# Patient Record
Sex: Female | Born: 1990
Health system: Southern US, Community
[De-identification: ages and names within clinical notes are randomized; demographics above are authoritative.]

## PROBLEM LIST (undated history)

## (undated) DIAGNOSIS — R011 Cardiac murmur, unspecified: Secondary | ICD-10-CM

## (undated) DIAGNOSIS — I38 Endocarditis, valve unspecified: Secondary | ICD-10-CM

## (undated) DIAGNOSIS — F329 Major depressive disorder, single episode, unspecified: Secondary | ICD-10-CM

## (undated) DIAGNOSIS — F32A Depression, unspecified: Secondary | ICD-10-CM

## (undated) DIAGNOSIS — F419 Anxiety disorder, unspecified: Secondary | ICD-10-CM

## (undated) HISTORY — DX: Depression, unspecified: F32.A

## (undated) HISTORY — DX: Anxiety disorder, unspecified: F41.9

## (undated) HISTORY — PX: NO PAST SURGERIES: SHX2092

---
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
## 2011-08-30 ENCOUNTER — Emergency Department: Payer: Self-pay | Admitting: Emergency Medicine

## 2011-09-17 ENCOUNTER — Emergency Department: Payer: Self-pay | Admitting: Emergency Medicine

## 2012-04-16 ENCOUNTER — Emergency Department: Payer: Self-pay | Admitting: Emergency Medicine

## 2012-04-17 LAB — CBC
HCT: 35.5 % (ref 35.0–47.0)
HGB: 12.5 g/dL (ref 12.0–16.0)
MCV: 92 fL (ref 80–100)
Platelet: 172 10*3/uL (ref 150–440)
RDW: 12.9 % (ref 11.5–14.5)
WBC: 10.9 10*3/uL (ref 3.6–11.0)

## 2012-04-17 LAB — URINALYSIS, COMPLETE
Bilirubin,UR: NEGATIVE
Blood: NEGATIVE
Leukocyte Esterase: NEGATIVE
Ph: 6 (ref 4.5–8.0)
Protein: NEGATIVE
WBC UR: 2 /HPF (ref 0–5)

## 2012-04-17 LAB — BASIC METABOLIC PANEL
Creatinine: 0.59 mg/dL — ABNORMAL LOW (ref 0.60–1.30)
Glucose: 73 mg/dL (ref 65–99)
Osmolality: 272 (ref 275–301)
Sodium: 138 mmol/L (ref 136–145)

## 2012-04-17 LAB — HCG, QUANTITATIVE, PREGNANCY: Beta Hcg, Quant.: 34240 m[IU]/mL — ABNORMAL HIGH

## 2012-05-19 LAB — URINE CULTURE

## 2012-10-04 ENCOUNTER — Inpatient Hospital Stay: Payer: Self-pay | Admitting: Obstetrics and Gynecology

## 2012-10-04 LAB — CBC WITH DIFFERENTIAL/PLATELET
Basophil #: 0.1 10*3/uL (ref 0.0–0.1)
Basophil %: 0.3 %
Eosinophil #: 0 10*3/uL (ref 0.0–0.7)
HCT: 29.6 % — ABNORMAL LOW (ref 35.0–47.0)
Lymphocyte #: 3 10*3/uL (ref 1.0–3.6)
MCH: 27 pg (ref 26.0–34.0)
MCHC: 33 g/dL (ref 32.0–36.0)
MCV: 82 fL (ref 80–100)
Monocyte #: 1.2 x10 3/mm — ABNORMAL HIGH (ref 0.2–0.9)
Monocyte %: 5.6 %
Neutrophil #: 16.8 10*3/uL — ABNORMAL HIGH (ref 1.4–6.5)
RBC: 3.61 10*6/uL — ABNORMAL LOW (ref 3.80–5.20)
RDW: 16.2 % — ABNORMAL HIGH (ref 11.5–14.5)
WBC: 21 10*3/uL — ABNORMAL HIGH (ref 3.6–11.0)

## 2012-10-06 LAB — HEMATOCRIT: HCT: 24.8 % — ABNORMAL LOW (ref 35.0–47.0)

## 2015-03-03 NOTE — H&P (Signed)
L&D Evaluation:  History Expanded:   HPI 24 yo G1 with EDD of 10/06/12 per 1st trim US, presents at 39 5/7 weeks with c/o regular contractions. Denies LOF or VB, +FM. [facility] notable for early entry to care, low BMI with low initial weight gain (25 lb overall weight gain), anemia.    Blood Type (Maternal) A positive    Group B Strep Results Maternal (Result >5wks must be treated as unknown) positive    Maternal HIV Negative    Maternal Syphilis Ab Nonreactive    Maternal Varicella Immune    Rubella Results (Maternal) immune    Patient's Medical History Asthma  "leaky heart valve"    Patient's Surgical History none    Medications Pre Natal Vitamins    Allergies NKDA    Social History none   ROS:   ROS see HPI   Exam:   Vital Signs stable    General no apparent distress    Mental Status clear    Chest clear    Heart no murmur/gallop/rubs    Abdomen gravid, tender with contractions    Estimated Fetal Weight Average for gestational age, EFW per Leopolds6- 6.5 lbs    Fetal Position vertex    Pelvic no external lesions, from 3 cm on admission to 4 cm now per RN    Mebranes Intact    FHT normal rate with no decels, BL 135, mod variability, + accels    Ucx regular, q 2-4 min   Impression:   Impression active labor   Plan:   Plan monitor contractions and for cervical change, antibiotics for GBBS prophylaxis    Comments IV medication, epidural as desired   Electronic Signatures: Leshon Armistead, Marta Lamasamara K (CNM)  (Signed 12-Dec-13 12:36)  Authored: L&D Evaluation   Last Updated: 12-Dec-13 12:36 by Vella KohlerBrothers, Johara Lodwick K (CNM)

## 2016-01-29 ENCOUNTER — Encounter: Payer: Self-pay | Admitting: *Deleted

## 2016-01-29 ENCOUNTER — Other Ambulatory Visit: Payer: Self-pay

## 2016-01-29 MED ORDER — BUPIVACAINE HCL (PF) 0.5 % IJ SOLN
INTRAMUSCULAR | Status: AC
  Filled 2016-01-29: qty 30

## 2016-01-29 NOTE — Pre-Procedure Instructions (Signed)
CALLED DR Henrene HawkingKEPHART REGARDING PT WITH A LEAKY VALVE-DR KEPHART SAID NO EKG WAS NEEDED BUT THAT WE DID NEED TO GET THE ECHO RESULTS SO WE KNOW WHICH VALVE IT IS THAT IS LEAKING

## 2016-01-29 NOTE — Pre-Procedure Instructions (Signed)
Called [facility] where pt had her Echo done 4-5 yrs ago and they have no record of an Echo-I even looked thru our old hospital system to see if maybe there was an Echo and there was none to be found.  Pt states that Dr Glenis SmokerNeimeyer is the one that ordered the Echo and it was done at his office and they should have the results. I told the pt  i had called and they cannot locate those results. I told pt that Anesthesia wants medical clearance due to uncertainty of which valve leaks. Pt verbalized understanding. I called Har[facility] at [facility] and left her a message regarding this and that i am initiating the clearance but that her office will need to follow up with clearance. I called [facility] and informed them that pt needs to be cleared before she can have her surgery. Faxed info over to both offices.

## 2016-02-02 NOTE — Patient Instructions (Signed)
  Your procedure is scheduled on: 4/13 Report to Day Surgery. To find out your arrival time please call [phone] between 1PM - 3PM on 4/12.  Remember: Instructions that are not followed completely may result in serious medical risk, up to and including death, or upon the discretion of your surgeon and anesthesiologist your surgery may need to be rescheduled.    __x__ 1. Do not eat food or drink liquids after midnight. No gum chewing or hard candies.     __x__ 2. No Alcohol for 24 hours before or after surgery.   ____ 3. Bring all medications with you on the day of surgery if instructed.    __x__ 4. Notify your doctor if there is any change in your medical condition     (cold, fever, infections).     Do not wear jewelry, make-up, hairpins, clips or nail polish.  Do not wear lotions, powders, or perfumes. You may wear deodorant.  Do not shave 48 hours prior to surgery. Men may shave face and neck.  Do not bring valuables to the hospital.    [facility] is not responsible for any belongings or valuables.               Contacts, dentures or bridgework may not be worn into surgery.  Leave your suitcase in the car. After surgery it may be brought to your room.  For patients admitted to the hospital, discharge time is determined by your                treatment team.   Patients discharged the day of surgery will not be allowed to drive home.   Please read over the following fact sheets that you were given:      ____ Take these medicines the morning of surgery with A SIP OF WATER:    1.  2.   3.   4.  5.  6.  ____ Fleet Enema (as directed)   ____ Use CHG Soap as directed  ____ Use inhalers on the day of surgery  ____ Stop metformin 2 days prior to surgery    ____ Take 1/2 of usual insulin dose the night before surgery and none on the morning of surgery.   ____ Stop Coumadin/Plavix/aspirin on   ____ Stop Anti-inflammatories on tylenol only until surgery   ____ Stop  supplements until after surgery.    ____ Bring C-Pap to the hospital.

## 2016-02-04 ENCOUNTER — Ambulatory Visit
Admission: RE | Admit: 2016-02-04 | Discharge: 2016-02-04 | Disposition: A | Discharge status: Home / Self Care | Payer: Medicaid Other | Source: Ambulatory Visit | Attending: Obstetrics and Gynecology | Admitting: Obstetrics and Gynecology

## 2016-02-04 ENCOUNTER — Ambulatory Visit: Payer: Medicaid Other | Admitting: Anesthesiology

## 2016-02-04 ENCOUNTER — Encounter: Payer: Self-pay | Admitting: *Deleted

## 2016-02-04 ENCOUNTER — Encounter
Admission: RE | Disposition: A | Discharge status: Home / Self Care | Payer: Self-pay | Source: Ambulatory Visit | Attending: Obstetrics and Gynecology

## 2016-02-04 ENCOUNTER — Ambulatory Visit: Payer: Medicaid Other

## 2016-02-04 DIAGNOSIS — Z808 Family history of malignant neoplasm of other organs or systems: Secondary | ICD-10-CM | POA: Insufficient documentation

## 2016-02-04 DIAGNOSIS — Z8249 Family history of ischemic heart disease and other diseases of the circulatory system: Secondary | ICD-10-CM | POA: Diagnosis not present

## 2016-02-04 DIAGNOSIS — Z841 Family history of disorders of kidney and ureter: Secondary | ICD-10-CM | POA: Diagnosis not present

## 2016-02-04 DIAGNOSIS — J45909 Unspecified asthma, uncomplicated: Secondary | ICD-10-CM | POA: Insufficient documentation

## 2016-02-04 DIAGNOSIS — Z975 Presence of (intrauterine) contraceptive device: Secondary | ICD-10-CM | POA: Diagnosis present

## 2016-02-04 DIAGNOSIS — T859XXA Unspecified complication of internal prosthetic device, implant and graft, initial encounter: Secondary | ICD-10-CM | POA: Insufficient documentation

## 2016-02-04 DIAGNOSIS — Z801 Family history of malignant neoplasm of trachea, bronchus and lung: Secondary | ICD-10-CM | POA: Insufficient documentation

## 2016-02-04 DIAGNOSIS — Z189 Retained foreign body fragments, unspecified material: Secondary | ICD-10-CM

## 2016-02-04 HISTORY — DX: Endocarditis, valve unspecified: I38

## 2016-02-04 HISTORY — DX: Cardiac murmur, unspecified: R01.1

## 2016-02-04 HISTORY — PX: HYSTEROSCOPY: SHX211

## 2016-02-04 HISTORY — PX: LAPAROSCOPY: SHX197

## 2016-02-04 LAB — POCT PREGNANCY, URINE: Preg Test, Ur: NEGATIVE

## 2016-02-04 SURGERY — HYSTEROSCOPY
Anesthesia: General | Wound class: Clean Contaminated

## 2016-02-04 MED ORDER — ONDANSETRON HCL 4 MG/2ML IJ SOLN
INTRAMUSCULAR | Status: AC
  Filled 2016-02-04: qty 2

## 2016-02-04 MED ORDER — FAMOTIDINE 20 MG PO TABS
ORAL_TABLET | ORAL | Status: AC
  Administered 2016-02-04: 20 mg via ORAL
  Filled 2016-02-04: qty 1

## 2016-02-04 MED ORDER — FENTANYL CITRATE (PF) 100 MCG/2ML IJ SOLN
INTRAMUSCULAR | Status: DC | PRN
  Administered 2016-02-04: 25 ug via INTRAVENOUS
  Administered 2016-02-04: 50 ug via INTRAVENOUS

## 2016-02-04 MED ORDER — KETOROLAC TROMETHAMINE 15 MG/ML IJ SOLN
INTRAMUSCULAR | Status: DC | PRN
  Administered 2016-02-04: 30 mg via INTRAVENOUS

## 2016-02-04 MED ORDER — IBUPROFEN 600 MG PO TABS: 600.0000 mg | ORAL_TABLET | Freq: Four times a day (QID) | ORAL | Status: DC | PRN

## 2016-02-04 MED ORDER — HYDROCODONE-ACETAMINOPHEN 5-325 MG PO TABS: 1.0000 | ORAL_TABLET | Freq: Four times a day (QID) | ORAL | Status: DC | PRN

## 2016-02-04 MED ORDER — NEOSTIGMINE METHYLSULFATE 10 MG/10ML IV SOLN
INTRAVENOUS | Status: DC | PRN
  Administered 2016-02-04: 4 mg via INTRAVENOUS

## 2016-02-04 MED ORDER — ONDANSETRON HCL 4 MG/2ML IJ SOLN
4.0000 mg | Freq: Once | INTRAMUSCULAR | Status: AC | PRN
  Administered 2016-02-04: 4 mg via INTRAVENOUS

## 2016-02-04 MED ORDER — VASOPRESSIN 20 UNIT/ML IJ SOLN
INTRAMUSCULAR | Status: DC | PRN
  Administered 2016-02-04: 2.5 [IU]

## 2016-02-04 MED ORDER — EPHEDRINE SULFATE 50 MG/ML IJ SOLN
INTRAMUSCULAR | Status: DC | PRN
  Administered 2016-02-04 (×2): 10 mg via INTRAVENOUS

## 2016-02-04 MED ORDER — FENTANYL CITRATE (PF) 100 MCG/2ML IJ SOLN
INTRAMUSCULAR | Status: AC
  Administered 2016-02-04: 25 ug via INTRAVENOUS
  Filled 2016-02-04: qty 2

## 2016-02-04 MED ORDER — ROCURONIUM BROMIDE 100 MG/10ML IV SOLN
INTRAVENOUS | Status: DC | PRN
  Administered 2016-02-04: 35 mg via INTRAVENOUS

## 2016-02-04 MED ORDER — MIDAZOLAM HCL 5 MG/5ML IJ SOLN
INTRAMUSCULAR | Status: DC | PRN
  Administered 2016-02-04: 2 mg via INTRAVENOUS

## 2016-02-04 MED ORDER — ACETAMINOPHEN 160 MG/5ML PO SOLN
10.0000 mg/kg | Freq: Once | ORAL | Status: DC
  Filled 2016-02-04: qty 20

## 2016-02-04 MED ORDER — PROMETHAZINE HCL 25 MG RE SUPP
RECTAL | Status: AC
  Filled 2016-02-04: qty 1

## 2016-02-04 MED ORDER — FENTANYL CITRATE (PF) 100 MCG/2ML IJ SOLN
25.0000 ug | INTRAMUSCULAR | Status: DC | PRN
  Administered 2016-02-04: 25 ug via INTRAVENOUS

## 2016-02-04 MED ORDER — PROPOFOL 10 MG/ML IV BOLUS
INTRAVENOUS | Status: DC | PRN
  Administered 2016-02-04: 150 mg via INTRAVENOUS

## 2016-02-04 MED ORDER — BUPIVACAINE HCL (PF) 0.5 % IJ SOLN
INTRAMUSCULAR | Status: AC
Start: 2016-02-04 — End: 2016-02-04
  Filled 2016-02-04: qty 30

## 2016-02-04 MED ORDER — GLYCOPYRROLATE 0.2 MG/ML IJ SOLN
INTRAMUSCULAR | Status: DC | PRN
  Administered 2016-02-04: 0.6 mg via INTRAVENOUS

## 2016-02-04 MED ORDER — PROMETHAZINE HCL 25 MG RE SUPP
25.0000 mg | Freq: Once | RECTAL | Status: AC
  Administered 2016-02-04: 25 mg via RECTAL

## 2016-02-04 MED ORDER — ACETAMINOPHEN 80 MG RE SUPP
10.0000 mg/kg | Freq: Once | RECTAL | Status: DC
  Filled 2016-02-04: qty 1

## 2016-02-04 MED ORDER — LIDOCAINE HCL (CARDIAC) 20 MG/ML IV SOLN
INTRAVENOUS | Status: DC | PRN
  Administered 2016-02-04: 80 mg via INTRAVENOUS

## 2016-02-04 MED ORDER — LACTATED RINGERS IV SOLN
INTRAVENOUS | Status: DC
  Administered 2016-02-04 (×2): via INTRAVENOUS

## 2016-02-04 MED ORDER — ONDANSETRON HCL 4 MG/2ML IJ SOLN
INTRAMUSCULAR | Status: DC | PRN
  Administered 2016-02-04: 4 mg via INTRAVENOUS

## 2016-02-04 MED ORDER — FAMOTIDINE 20 MG PO TABS
20.0000 mg | ORAL_TABLET | Freq: Once | ORAL | Status: AC
  Administered 2016-02-04: 20 mg via ORAL

## 2016-02-04 SURGICAL SUPPLY — 38 items
BLADE SURG SZ11 CARB STEEL (BLADE) ×3 IMPLANT
CANISTER SUCT 1200ML W/VALVE (MISCELLANEOUS) ×3 IMPLANT
CANISTER SUCT 3000ML (MISCELLANEOUS) ×3 IMPLANT
CATH ROBINSON RED A/P 16FR (CATHETERS) ×3 IMPLANT
CHLORAPREP W/TINT 26ML (MISCELLANEOUS) ×3 IMPLANT
DRSG TEGADERM 2-3/8X2-3/4 SM (GAUZE/BANDAGES/DRESSINGS) ×3 IMPLANT
GAUZE SPONGE NON-WVN 2X2 STRL (MISCELLANEOUS) ×1 IMPLANT
GLOVE BIO SURGEON STRL SZ7 (GLOVE) ×12 IMPLANT
GLOVE INDICATOR 7.5 STRL GRN (GLOVE) ×3 IMPLANT
GOWN STRL REUS W/ TWL LRG LVL3 (GOWN DISPOSABLE) ×2 IMPLANT
GOWN STRL REUS W/TWL LRG LVL3 (GOWN DISPOSABLE) ×4
IRRIGATION STRYKERFLOW (MISCELLANEOUS) ×1 IMPLANT
IRRIGATOR STRYKERFLOW (MISCELLANEOUS) ×3
IV LACTATED RINGERS 1000ML (IV SOLUTION) ×3 IMPLANT
KIT RM TURNOVER CYSTO AR (KITS) ×3 IMPLANT
LABEL OR SOLS (LABEL) ×3 IMPLANT
LIQUID BAND (GAUZE/BANDAGES/DRESSINGS) ×6 IMPLANT
MYOSURE LITE POLYP REMOVAL (MISCELLANEOUS) ×3 IMPLANT
NS IRRIG 500ML POUR BTL (IV SOLUTION) ×3 IMPLANT
PACK DNC HYST (MISCELLANEOUS) ×3 IMPLANT
PACK GYN LAPAROSCOPIC (MISCELLANEOUS) ×3 IMPLANT
PAD OB MATERNITY 4.3X12.25 (PERSONAL CARE ITEMS) ×3 IMPLANT
PAD PREP 24X41 OB/GYN DISP (PERSONAL CARE ITEMS) ×3 IMPLANT
SCISSORS METZENBAUM CVD 33 (INSTRUMENTS) ×3 IMPLANT
SEAL ROD LENS SCOPE MYOSURE (ABLATOR) ×3 IMPLANT
SET CYSTO W/LG BORE CLAMP LF (SET/KITS/TRAYS/PACK) ×3 IMPLANT
SHEARS HARMONIC ACE PLUS 36CM (ENDOMECHANICALS) IMPLANT
SLEEVE ENDOPATH XCEL 5M (ENDOMECHANICALS) ×3 IMPLANT
SPONGE VERSALON 2X2 STRL (MISCELLANEOUS) ×2
SUT MNCRL AB 4-0 PS2 18 (SUTURE) ×3 IMPLANT
SUT VIC AB 0 CT2 27 (SUTURE) ×3 IMPLANT
SYRINGE 10CC LL (SYRINGE) ×3 IMPLANT
TROCAR ENDO BLADELESS 11MM (ENDOMECHANICALS) IMPLANT
TROCAR XCEL NON-BLD 5MMX100MML (ENDOMECHANICALS) ×3 IMPLANT
TUBING CONNECTING 10 (TUBING) ×2 IMPLANT
TUBING CONNECTING 10' (TUBING) ×1
TUBING HYSTEROSCOPY DOLPHIN (MISCELLANEOUS) IMPLANT
TUBING INSUFFLATOR HI FLOW (MISCELLANEOUS) ×3 IMPLANT

## 2016-02-04 NOTE — Progress Notes (Signed)
Phenergan 25mg  supp given for contain nausea   Not having a lot of pain but just soreness

## 2016-02-04 NOTE — Transfer of Care (Signed)
Immediate Anesthesia Transfer of Care Note  Patient: Lori Dixon  Procedure(s) Performed: Procedure(s): HYSTEROSCOPY WITH MYOSURE (N/A) LAPAROSCOPY DIAGNOSTIC (N/A)  Patient Location: PACU  Anesthesia Type:General  Level of Consciousness: responds to stimulation, sleeping  Airway & Oxygen Therapy: Patient Spontanous Breathing and Patient connected to face mask oxygen  Post-op Assessment: Report given to RN and Post -op Vital signs reviewed and stable  Post vital signs: Reviewed and stable  Last Vitals:  Filed Vitals:   02/04/16 1217 02/04/16 1552  BP: 112/71 125/79  Pulse: 65 61  Temp: 37.1 C   Resp: 18 23    Complications: No apparent anesthesia complications

## 2016-02-04 NOTE — Anesthesia Procedure Notes (Addendum)
Procedure Name: Intubation Date/Time: 02/04/2016 1:49 PM Performed by: Chong SicilianLOPEZ, Jatasia Gundrum Pre-anesthesia Checklist: Patient identified, Emergency Drugs available, Suction available, Patient being monitored and Timeout performed Patient Re-evaluated:Patient Re-evaluated prior to inductionOxygen Delivery Method: Circle system utilized Preoxygenation: Pre-oxygenation with 100% oxygen Intubation Type: IV induction Ventilation: Mask ventilation without difficulty LMA Size: 3.5 Tube type: Oral Number of attempts: 1 Airway Equipment and Method: Stylet Placement Confirmation: ETT inserted through vocal cords under direct vision,  positive ETCO2 and breath sounds checked- equal and bilateral Tube secured with: Tape Dental Injury: Teeth and Oropharynx as per pre-operative assessment    Procedure Name: Intubation Date/Time: 02/04/2016 2:11 PM Performed by: Chong SicilianLOPEZ, Avrian Delfavero Pre-anesthesia Checklist: Patient identified, Emergency Drugs available, Suction available, Patient being monitored and Timeout performed Patient Re-evaluated:Patient Re-evaluated prior to inductionOxygen Delivery Method: Circle system utilized Preoxygenation: Pre-oxygenation with 100% oxygen Intubation Type: IV induction Ventilation: Mask ventilation without difficulty Laryngoscope Size: Mac and 3 Grade View: Grade I Tube type: Oral Tube size: 7.0 mm Number of attempts: 1 Airway Equipment and Method: Stylet Placement Confirmation: ETT inserted through vocal cords under direct vision,  positive ETCO2 and breath sounds checked- equal and bilateral Secured at: 21 cm Tube secured with: Tape Dental Injury: Teeth and Oropharynx as per pre-operative assessment  Comments: Intubated because case was converted to laparoscopic

## 2016-02-04 NOTE — H&P (Signed)
Initial clinic H&P on chart and reviewed  History reviewed, patient examined, no change in status, stable for surgery.

## 2016-02-04 NOTE — Anesthesia Preprocedure Evaluation (Signed)
Anesthesia Evaluation  Patient identified by MRN, date of birth, ID band Patient awake    Reviewed: Allergy & Precautions, NPO status , Patient's Chart, lab work & pertinent test results  History of Anesthesia Complications Negative for: history of anesthetic complications  Airway Mallampati: II       Dental   Pulmonary neg pulmonary ROS,           Cardiovascular negative cardio ROS  + Valvular Problems/Murmurs (as a child)      Neuro/Psych negative neurological ROS     GI/Hepatic negative GI ROS, Neg liver ROS,   Endo/Other  negative endocrine ROS  Renal/GU negative Renal ROS     Musculoskeletal   Abdominal   Peds  Hematology negative hematology ROS (+)   Anesthesia Other Findings   Reproductive/Obstetrics                             Anesthesia Physical Anesthesia Plan  ASA: II  Anesthesia Plan: General   Post-op Pain Management:    Induction: Intravenous  Airway Management Planned: LMA  Additional Equipment:   Intra-op Plan:   Post-operative Plan:   Informed Consent: I have reviewed the patients History and Physical, chart, labs and discussed the procedure including the risks, benefits and alternatives for the proposed anesthesia with the patient or authorized representative who has indicated his/her understanding and acceptance.     Plan Discussed with:   Anesthesia Plan Comments:         Anesthesia Quick Evaluation

## 2016-02-04 NOTE — Discharge Instructions (Signed)

## 2016-02-04 NOTE — Progress Notes (Signed)
zofran given for nausea   Vomited up scant amt of bile

## 2016-02-04 NOTE — Op Note (Signed)
Preoperative Diagnosis: 1) 25 y.o.  With concern for retained drug eluding sleeve of Mirena IUD  Postoperative Diagnosis: 1) 25 y.o. no evidence of drug eluding sleeve of Mirena IUD  Operation Performed: Hysteroscopy, diagnostic laparoscopy  Indication: 25 y.o. who had a [facility]rena [facility], had attempted removal late last year and a portion of the IUD broke during removal.  On removal in the office after being seen by myself for consultation the IUD was removed but no drug eluding sleeve was visualized.  Patient lost to follow up because of lapse in insurance but subsequent transvaginal ultrasound showed a linear area in the posterior endometrium that was concerning for retained IUD sleeve.    Anesthesia: General  Preoperative Antibiotics: none  Estimated Blood Loss: minimal  IV Fluids: 1L  Drains or Tubes: none  Implants: none  Specimens Removed: endometrial curettings  Complications: none  Intraoperative Findings: Hysteroscopy revealed normal uterine cavity no evidence of defects, there was a small dimpling noted in mid fundus just left of midline.  Sample of endometrium were taken to examine for progestin effect.  The lining was thicker than what would be expected with Mirena IUD in situ.  Decision was made to examine uterus abdominal via laparoscopy.  This revealed normal tubes, ovaries, and uterus. The cul de sac had minimal filmy adhesion, liver and gallbladder appeared normal, to omentum was free of evidence of foreign body.  There was a small area in the left posterior fundus which was raised.  This was injected with 14mL of 20U/19100mL NS vasopressin solution, then incised with cold scissors with no evidence of IUD sleeve.  An intraoperative X-ray did not reveal evidence of linear structure consistent with sleeve of IUD.  The uterus was visualized secondary to some retained fluid from the hysteroscopy.  Patient Condition: stable  Procedure in Detail:  Patient  was taken to the operating room where she was administered general anesthesia.  She was positioned in the dorsal lithotomy position utilizing Allen stirups, prepped and draped in the usual sterile fashion.  Prior to proceeding with procedure a time out was performed.  Attention was turned to the patient's pelvis.  A red rubber catheter was used to empty the patient's bladder.  An operative speculum was placed to allow visualization of the cervix.  The anterior lip of the cervix was grasped with a single tooth tenaculum, and the cervix was sequentially dilated using Pratt dilators.  A Myosure hysteroscope was advanced into the uterine cavity noting the above findings.  The mysoure was used to collect a small sample of the endometrial lining for pathology.  The myometrium was not explored in an attempt to localize the IUD sleeve.   Hulka tenaculum was placed to allow manipulation of the uterus.  The operative speculum and single tooth tenaculum were then removed.  Attention was turned to the patient's abdomen.  The umbilicus was incised using an 11 blade scalpel.  A 5mm Excel trocar was then used to gain direct entry into the peritoneal cavity utilizing the camera to visualize progress of the trocar during placement.  Once peritoneal entry had been achieved, insufflation was started and pneumoperitoneum established at a pressure of 15mmHg.   General inspection of the abdomen revealed the above noted findings.  A second left lower quadrant port was also incised using an 11 blade scalpel.  A second 5mm excel trocar was place through this incision under direct visualization. Inspection noted the above findings.  The area in  question in the posterior left fundus was injected with 14 mL of 20U/140mL vasopressin with good blanching noted.  The uterine serosa overlying the area was incised using cold scissors with the incision being approxiamtely 5mm.  The area was probed but no IUD sleeve was visualized or palpated.  This  was very superficial and did not extend greater than 3mm into the myometrium. The serosal edges were cauterized to ensure no bleeding postoperatively. At this point and X-ray of the abdomen and pelvis were obtained with no evidence of linear structure to suggest retained foreign body/  Pneumoperitoneum was evacuated.  The trocars were removed.  All trocar sites were then dressed with surgical skin glue.  The Hulka tenaculum was removed.  Sponge needle and instrument counts were correct time two.  The patient tolerated the procedure well and was taken to the recovery room in stable condition.

## 2016-02-04 NOTE — Anesthesia Postprocedure Evaluation (Signed)
Anesthesia Post Note  Patient: Lori Dixon  Procedure(s) Performed: Procedure(s) (LRB): HYSTEROSCOPY WITH MYOSURE (N/A) LAPAROSCOPY DIAGNOSTIC (N/A)  Patient location during evaluation: PACU Anesthesia Type: General Level of consciousness: awake and alert and oriented Pain management: pain level controlled Vital Signs Assessment: post-procedure vital signs reviewed and stable Respiratory status: spontaneous breathing Cardiovascular status: blood pressure returned to baseline Anesthetic complications: no    Last Vitals:  Filed Vitals:   02/04/16 1716 02/04/16 1744  BP:  108/61  Pulse: 66 79  Temp:    Resp: 18 16    Last Pain:  Filed Vitals:   02/04/16 1745  PainSc: 3                  Henretta Quist

## 2016-02-05 ENCOUNTER — Encounter: Payer: Self-pay | Admitting: Obstetrics and Gynecology

## 2016-02-08 LAB — SURGICAL PATHOLOGY

## 2016-05-06 ENCOUNTER — Encounter: Payer: Self-pay | Admitting: Obstetrics and Gynecology

## 2016-10-24 DIAGNOSIS — Z113 Encounter for screening for infections with a predominantly sexual mode of transmission: Secondary | ICD-10-CM | POA: Insufficient documentation

## 2018-10-01 DIAGNOSIS — N911 Secondary amenorrhea: Secondary | ICD-10-CM | POA: Diagnosis not present

## 2018-10-01 DIAGNOSIS — Z3201 Encounter for pregnancy test, result positive: Secondary | ICD-10-CM | POA: Diagnosis not present

## 2018-10-08 DIAGNOSIS — O3680X0 Pregnancy with inconclusive fetal viability, not applicable or unspecified: Secondary | ICD-10-CM | POA: Diagnosis not present

## 2018-10-08 DIAGNOSIS — Z3A01 Less than 8 weeks gestation of pregnancy: Secondary | ICD-10-CM | POA: Diagnosis not present

## 2018-11-05 DIAGNOSIS — Z23 Encounter for immunization: Secondary | ICD-10-CM | POA: Diagnosis not present

## 2018-11-05 DIAGNOSIS — Z113 Encounter for screening for infections with a predominantly sexual mode of transmission: Secondary | ICD-10-CM | POA: Diagnosis not present

## 2018-11-05 DIAGNOSIS — F330 Major depressive disorder, recurrent, mild: Secondary | ICD-10-CM | POA: Diagnosis not present

## 2018-11-05 DIAGNOSIS — Z36 Encounter for antenatal screening for chromosomal anomalies: Secondary | ICD-10-CM | POA: Diagnosis not present

## 2018-11-05 DIAGNOSIS — Z118 Encounter for screening for other infectious and parasitic diseases: Secondary | ICD-10-CM | POA: Diagnosis not present

## 2018-11-05 DIAGNOSIS — Z3481 Encounter for supervision of other normal pregnancy, first trimester: Secondary | ICD-10-CM | POA: Diagnosis not present

## 2018-12-31 DIAGNOSIS — O09899 Supervision of other high risk pregnancies, unspecified trimester: Secondary | ICD-10-CM | POA: Insufficient documentation

## 2018-12-31 DIAGNOSIS — Z3A19 19 weeks gestation of pregnancy: Secondary | ICD-10-CM | POA: Diagnosis not present

## 2018-12-31 DIAGNOSIS — Z363 Encounter for antenatal screening for malformations: Secondary | ICD-10-CM | POA: Diagnosis not present

## 2018-12-31 DIAGNOSIS — O358XX0 Maternal care for other (suspected) fetal abnormality and damage, not applicable or unspecified: Secondary | ICD-10-CM | POA: Diagnosis not present

## 2019-01-29 DIAGNOSIS — Z3A23 23 weeks gestation of pregnancy: Secondary | ICD-10-CM | POA: Diagnosis not present

## 2019-03-04 DIAGNOSIS — Z131 Encounter for screening for diabetes mellitus: Secondary | ICD-10-CM | POA: Diagnosis not present

## 2019-03-04 DIAGNOSIS — Z3483 Encounter for supervision of other normal pregnancy, third trimester: Secondary | ICD-10-CM | POA: Diagnosis not present

## 2019-04-01 DIAGNOSIS — Z3A32 32 weeks gestation of pregnancy: Secondary | ICD-10-CM | POA: Diagnosis not present

## 2019-04-01 DIAGNOSIS — O09893 Supervision of other high risk pregnancies, third trimester: Secondary | ICD-10-CM | POA: Diagnosis not present

## 2019-06-13 DIAGNOSIS — Z1389 Encounter for screening for other disorder: Secondary | ICD-10-CM | POA: Diagnosis not present

## 2019-07-11 DIAGNOSIS — Z118 Encounter for screening for other infectious and parasitic diseases: Secondary | ICD-10-CM | POA: Diagnosis not present

## 2019-07-11 DIAGNOSIS — Z01419 Encounter for gynecological examination (general) (routine) without abnormal findings: Secondary | ICD-10-CM | POA: Diagnosis not present

## 2019-07-31 DIAGNOSIS — Z111 Encounter for screening for respiratory tuberculosis: Secondary | ICD-10-CM | POA: Diagnosis not present

## 2019-07-31 DIAGNOSIS — Z113 Encounter for screening for infections with a predominantly sexual mode of transmission: Secondary | ICD-10-CM | POA: Diagnosis not present

## 2019-08-12 ENCOUNTER — Other Ambulatory Visit: Payer: Self-pay

## 2019-08-12 ENCOUNTER — Ambulatory Visit (LOCAL_COMMUNITY_HEALTH_CENTER): Payer: Medicaid Other

## 2019-08-12 DIAGNOSIS — Z23 Encounter for immunization: Secondary | ICD-10-CM

## 2019-08-12 NOTE — Progress Notes (Signed)
Client presents for Hepatitis B vaccine as recent titer at [facility] in [place] indicated [place]. Client does not have titer results with her, but thinks they may be in her car. Returned to car to ascertain if results available. Previously received 3 hepatitis B vaccines per [facility]. Client returned to clinic with copy of titer results which are non-reactive. Copy labeled and sent for scanning. HBV and Flu vaccines administered and client aware titer needs to be repeated in 4 weeks. Rich Number, RN

## 2019-09-05 ENCOUNTER — Ambulatory Visit: Payer: Medicaid Other

## 2019-09-10 ENCOUNTER — Other Ambulatory Visit: Payer: Self-pay

## 2019-09-10 ENCOUNTER — Ambulatory Visit (LOCAL_COMMUNITY_HEALTH_CENTER): Payer: Medicaid Other

## 2019-09-10 ENCOUNTER — Ambulatory Visit: Payer: Medicaid Other

## 2019-09-10 DIAGNOSIS — Z0184 Encounter for antibody response examination: Secondary | ICD-10-CM | POA: Diagnosis not present

## 2019-09-10 NOTE — Progress Notes (Signed)
Pt here for Hep B Titer only. Pt counseled that she should get a call when test results are in. Pt sent to finance to pay and sent to lab.Ronny Bacon, RN

## 2019-09-11 ENCOUNTER — Telehealth: Payer: Self-pay

## 2019-09-11 LAB — HEPATITIS B SURFACE ANTIBODY, QUANTITATIVE: Hepatitis B Surf Ab Quant: >1000 m[IU]/mL (ref >9.9)

## 2019-09-11 NOTE — Telephone Encounter (Signed)
Notified Hepatitis B titer result indicates immunity and no additional vaccine needed at this time. Copy of results placed in sealed envelope for client to retrieve at information booth. Envelope noted to send client to [facility] to complete ROI. Rich Number, RN

## 2020-02-05 NOTE — Progress Notes (Signed)
New Patient Office Visit  Leo Rod Wolford,acting as a Neurosurgeon for [employer], FNP.,have documented all relevant documentation on the behalf of Jairo Ben, FNP,as directed by  Jairo Ben, FNP while in the presence of Jairo Ben, FNP. Subjective:  Patient ID: Lori Dixon, female    DOB: 08/23/1991  Age: 29 y.o. MRN: [id number]  CC:  Chief Complaint  Patient presents with  . New Patient (Initial Visit)    HPI Lori Dixon presents for new patient appointment. Patient states that she has a PMH of anxiety and depression and would like to address those problems today. Patient has previously been on Sertraline in the past but patient reports poor symptom control on medication. Patient reports that she has had two sucide attempts previously in past with overuse of pills, patient states today that she has sucidal thoughts at times but no plans, she also denies homicidal thoughts.  Patient states that she would like to discuss contraception management today, patient is in a monogamous relationship and reports that she has 3 children. Patient had concerns of absence of menses she reports LMP was 11/25/19. Patient states at home pregnancy test were negative, she denies any past history of irregular menses.  Patient reports that she has had difficulty sleeping at night and reports waking up often, on average patient reports that she sleeps 6hrs a night. Patient reports that she is actively exercising and going to gym,  -Pt Due for PAP, she last reports 2018   Results were normal according to patient  - appears she sees gynecology at [facility] in care everywhere .  Constipation bowel movement 1 every 3 days. Denies any blood, tarry stools or pain with passing bowel movements.  Denies any dizziness, lightheadedness, syncope or presyncope.  Patient  denies any fever, body aches,chills, rash, chest pain, shortness of breath, nausea, vomiting, or diarrhea.     Past Medical History:  Diagnosis Date  . Anxiety   . Depression   . Heart murmur    AS A CHILD  . Leaky heart valve     Past Surgical History:  Procedure Laterality Date  . HYSTEROSCOPY N/A 02/04/2016   Procedure: HYSTEROSCOPY WITH MYOSURE;  Surgeon: Vena Austria, MD;  Location: [facility] ORS;  Service: Gynecology;  Laterality: N/A;  . LAPAROSCOPY N/A 02/04/2016   Procedure: LAPAROSCOPY DIAGNOSTIC;  Surgeon: Vena Austria, MD;  Location: [facility] ORS;  Service: Gynecology;  Laterality: N/A;  . NO PAST SURGERIES      Family History  Problem Relation Age of Onset  . Melanoma Mother   . Hypertension Maternal Grandfather   . Hyperlipidemia Maternal Grandfather     Social History   Socioeconomic History  . Marital status: Married    Spouse name: Not on file  . Number of children: Not on file  . Years of education: Not on file  . Highest education level: Not on file  Occupational History  . Not on file  Tobacco Use  . Smoking status: Never Smoker  Substance and Sexual Activity  . Alcohol use: Yes    Comment: OCC  . Drug use: No  . Sexual activity: Not on file  Other Topics Concern  . Not on file  Social History Narrative  . Not on file   Social Determinants of Health   Financial Resource Strain:   . Difficulty of Paying Living Expenses:   Food Insecurity:   . Worried About Programme researcher, broadcasting/film/video in the Last Year:   .  Ran Out of Food in the Last Year:   Transportation Needs:   . Film/video editor (Medical):   Marland Kitchen Lack of Transportation (Non-Medical):   Physical Activity:   . Days of Exercise per Week:   . Minutes of Exercise per Session:   Stress:   . Feeling of Stress :   Social Connections:   . Frequency of Communication with Friends and Family:   . Frequency of Social Gatherings with Friends and Family:   . Attends Religious Services:   . Active Member of Clubs or Organizations:   . Attends Archivist Meetings:   Marland Kitchen Marital Status:    Intimate Partner Violence:   . Fear of Current or Ex-Partner:   . Emotionally Abused:   Marland Kitchen Physically Abused:   . Sexually Abused:     ROS Review of Systems  Constitutional: Positive for fatigue and unexpected weight change.  Gastrointestinal: Positive for abdominal distention and abdominal pain.  Musculoskeletal: Positive for back pain.  Psychiatric/Behavioral: Positive for decreased concentration and suicidal ideas. The patient is nervous/anxious.   All other systems reviewed and are negative.   Objective:   Today's Vitals: BP 124/70   Pulse 100   Temp (!) 97 F (36.1 C) (Oral)   Resp 16   Ht 5\' 1"  (1.549 m)   Wt 138 lb 12.8 oz (63 kg)   LMP 11/25/2019 (Exact Date)   SpO2 98%   BMI 26.23 kg/m   Physical Exam Vitals reviewed.  Constitutional:      General: She is not in acute distress.    Appearance: Normal appearance. She is well-developed. She is not diaphoretic.     Interventions: She is not intubated. HENT:     Head: Normocephalic and atraumatic.     Right Ear: External ear normal.     Left Ear: External ear normal.     Nose: Nose normal.     Mouth/Throat:     Pharynx: No oropharyngeal exudate.  Eyes:     General: Lids are normal. No scleral icterus.       Right eye: No discharge.        Left eye: No discharge.     Extraocular Movements: Extraocular movements intact.     Conjunctiva/sclera: Conjunctivae normal.     Right eye: Right conjunctiva is not injected. No exudate or hemorrhage.    Left eye: Left conjunctiva is not injected. No exudate or hemorrhage.    Pupils: Pupils are equal, round, and reactive to light.  Neck:     Thyroid: No thyroid mass or thyromegaly.     Vascular: Normal carotid pulses. No carotid bruit, hepatojugular reflux or JVD.     Trachea: Trachea and phonation normal. No tracheal tenderness or tracheal deviation.     Meningeal: Brudzinski's sign and Kernig's sign absent.  Cardiovascular:     Rate and Rhythm: Normal rate and  regular rhythm.     Pulses: Normal pulses.          Radial pulses are 2+ on the right side and 2+ on the left side.       Dorsalis pedis pulses are 2+ on the right side and 2+ on the left side.       Posterior tibial pulses are 2+ on the right side and 2+ on the left side.     Heart sounds: Normal heart sounds, S1 normal and S2 normal. Heart sounds not distant. No murmur. No friction rub. No gallop.   Pulmonary:  Effort: Pulmonary effort is normal. No tachypnea, bradypnea, accessory muscle usage or respiratory distress. She is not intubated.     Breath sounds: Normal breath sounds. No stridor. No wheezing or rales.  Chest:     Chest wall: No tenderness.  Abdominal:     General: Bowel sounds are normal. There is no distension or abdominal bruit.     Palpations: Abdomen is soft. There is no shifting dullness, fluid wave, hepatomegaly, splenomegaly, mass or pulsatile mass.     Tenderness: There is no abdominal tenderness. There is no right CVA tenderness, left CVA tenderness, guarding or rebound.     Hernia: No hernia is present.  Genitourinary:    Comments: deferred for gynecology  Musculoskeletal:        General: No tenderness or deformity. Normal range of motion.     Cervical back: Full passive range of motion without pain, normal range of motion and neck supple. No edema, erythema or rigidity. No spinous process tenderness or muscular tenderness. Normal range of motion.  Lymphadenopathy:     Head:     Right side of head: No submental, submandibular, tonsillar, preauricular, posterior auricular or occipital adenopathy.     Left side of head: No submental, submandibular, tonsillar, preauricular, posterior auricular or occipital adenopathy.     Cervical: No cervical adenopathy.     Right cervical: No superficial, deep or posterior cervical adenopathy.    Left cervical: No superficial, deep or posterior cervical adenopathy.     Upper Body:     Right upper body: No supraclavicular or  pectoral adenopathy.     Left upper body: No supraclavicular or pectoral adenopathy.  Skin:    General: Skin is warm and dry.     Capillary Refill: Capillary refill takes less than 2 seconds.     Coloration: Skin is not pale.     Findings: No abrasion, bruising, burn, ecchymosis, erythema, lesion, petechiae or rash.     Nails: There is no clubbing.          Comments: Regular Small slightly raised cherry angionoma 0.3x0.573mm ,under  near bra line. Will monitor.  Neurological:     General: No focal deficit present.     Mental Status: She is alert and oriented to person, place, and time.     GCS: GCS eye subscore is 4. GCS verbal subscore is 5. GCS motor subscore is 6.     Cranial Nerves: No cranial nerve deficit.     Sensory: No sensory deficit.     Motor: No weakness, tremor, atrophy, abnormal muscle tone or seizure activity.     Coordination: Coordination normal.     Gait: Gait normal.     Deep Tendon Reflexes: Reflexes are normal and symmetric. Reflexes normal. Babinski sign absent on the right side. Babinski sign absent on the left side.     Reflex Scores:      Tricep reflexes are 2+ on the right side and 2+ on the left side.      Bicep reflexes are 2+ on the right side and 2+ on the left side.      Brachioradialis reflexes are 2+ on the right side and 2+ on the left side.      Patellar reflexes are 2+ on the right side and 2+ on the left side.      Achilles reflexes are 2+ on the right side and 2+ on the left side. Psychiatric:        Mood and Affect: Mood normal.  Speech: Speech normal.        Behavior: Behavior normal.        Thought Content: Thought content normal.        Judgment: Judgment normal.     Assessment & Plan:   Problem List Items Addressed This Visit      Other   Routine adult health maintenance - Primary   Relevant Orders   Comprehensive Metabolic Panel (CMET)   POCT urinalysis dipstick    Other Visit Diagnoses    Absence of menstruation        Relevant Orders   POCT urine pregnancy   hCG, serum, qualitative   Ambulatory referral to Obstetrics / Gynecology   Screening for lipid disorders       Relevant Orders   Lipid Panel w/o Chol/HDL Ratio   Leukocytes in urine       Relevant Orders   CBC with Differential/Platelet   Urine Culture   Screening for thyroid disorder       Relevant Orders   TSH   Screening for STD (sexually transmitted disease)       Relevant Orders   HIV Antibody (routine testing w rflx)   RPR   Urine cytology ancillary only   Hepatitis, Acute   Constipation, unspecified constipation type       Depression, recurrent (HCC)       Relevant Orders   Ambulatory referral to Psychiatry     Orders Placed This Encounter  Procedures  . Urine Culture  . HIV Antibody (routine testing w rflx)  . CBC with Differential/Platelet  . Comprehensive Metabolic Panel (CMET)  . TSH  . Lipid Panel w/o Chol/HDL Ratio  . hCG, serum, qualitative  . RPR  . Hepatitis, Acute  . Ambulatory referral to Obstetrics / Gynecology    Referral Priority:   Routine    Referral Type:   Consultation    Referral Reason:   Specialty Services Required    Requested Specialty:   Obstetrics and Gynecology    Number of Visits Requested:   1  . Ambulatory referral to Psychiatry    Referral Priority:   Routine    Referral Type:   Psychiatric    Referral Reason:   Specialty Services Required    Requested Specialty:   Psychiatry    Number of Visits Requested:   1  . POCT urine pregnancy  . POCT urinalysis dipstick   Outpatient Encounter Medications as of 02/06/2020  Medication Sig  . [DISCONTINUED] HYDROcodone-acetaminophen (NORCO/VICODIN) 5-325 MG tablet Take 1 tablet by mouth every 6 (six) hours as needed.  . [DISCONTINUED] ibuprofen (ADVIL,MOTRIN) 600 MG tablet Take 1 tablet (600 mg total) by mouth every 6 (six) hours as needed.  . [DISCONTINUED] Prenatal Vit-Fe Fumarate-FA (PNV PRENATAL PLUS MULTIVITAMIN) 27-1 MG TABS Take by mouth.   . [DISCONTINUED] Prenatal Vit-Fe Fumarate-FA (PRENATAL MULTIVITAMIN) TABS tablet Take 1 tablet by mouth daily at 12 noon.   No facility-administered encounter medications on file as of 02/06/2020.   Labs in the morning fasting on 02/07/2020  Blood HCG for pregnancy. POCT urine negative.    Results for orders placed or performed in visit on 02/06/20 (from the past 24 hour(s))  POCT urinalysis dipstick     Status: Abnormal   Collection Time: 02/06/20  3:58 PM  Result Value Ref Range   Color, UA yellow    Clarity, UA clear    Glucose, UA Negative Negative   Bilirubin, UA negative    Ketones, UA negative  Spec Grav, UA <=1.005 (A) 1.010 - 1.025   Blood, UA NEGATIVE    pH, UA 8.0 5.0 - 8.0   Protein, UA Negative Negative   Urobilinogen, UA 0.2 0.2 or 1.0 E.U./dL   Nitrite, UA NEGATIVE    Leukocytes, UA Small (1+) (A) Negative   Appearance     Odor    POCT urine pregnancy     Status: Normal   Collection Time: 02/06/20  3:59 PM  Result Value Ref Range   Preg Test, Ur Negative Negative   Follow-up: Return in about 3 months (around 05/07/2020).  She is advised of RED flags abdominal pain.  When to seek emergency care immediately.   Advised patient call the office or your primary care doctor for an appointment if no improvement within 72 hours or if any symptoms change or worsen at any time  Advised ER or urgent Care if after hours or on weekend. Call 911 for emergency symptoms at any time.Patinet verbalized understanding of all instructions given/reviewed and treatment plan and has no further questions or concerns at this time.     The entirety of the information documented in the History of Present Illness, Review of Systems and Physical Exam were personally obtained by me. Portions of this information were initially documented by the  Certified Medical Assistant whose name is documented in Epic and reviewed by me for thoroughness and accuracy.  I have personally performed the exam  and reviewed the chart and it is accurate to the best of my knowledge.  Museum/gallery conservator has been used and any errors in dictation or transcription are unintentional.  Eula Fried. Nolawi Kanady FNP-C  [facility]

## 2020-02-06 ENCOUNTER — Encounter: Payer: Self-pay | Admitting: Adult Health

## 2020-02-06 ENCOUNTER — Other Ambulatory Visit: Payer: Self-pay

## 2020-02-06 ENCOUNTER — Other Ambulatory Visit (HOSPITAL_COMMUNITY)
Admission: RE | Admit: 2020-02-06 | Discharge: 2020-02-06 | Disposition: A | Discharge status: Home / Self Care | Payer: 59 | Source: Ambulatory Visit | Attending: Adult Health | Admitting: Adult Health

## 2020-02-06 ENCOUNTER — Ambulatory Visit (INDEPENDENT_AMBULATORY_CARE_PROVIDER_SITE_OTHER): Payer: 59 | Admitting: Adult Health

## 2020-02-06 VITALS — BP 124/70 | HR 100 | Temp 97.0°F | Resp 16 | Ht 61.0 in | Wt 138.8 lb

## 2020-02-06 DIAGNOSIS — Z0000 Encounter for general adult medical examination without abnormal findings: Secondary | ICD-10-CM | POA: Diagnosis not present

## 2020-02-06 DIAGNOSIS — F339 Major depressive disorder, recurrent, unspecified: Secondary | ICD-10-CM | POA: Diagnosis not present

## 2020-02-06 DIAGNOSIS — R82998 Other abnormal findings in urine: Secondary | ICD-10-CM

## 2020-02-06 DIAGNOSIS — Z113 Encounter for screening for infections with a predominantly sexual mode of transmission: Secondary | ICD-10-CM

## 2020-02-06 DIAGNOSIS — Z1329 Encounter for screening for other suspected endocrine disorder: Secondary | ICD-10-CM

## 2020-02-06 DIAGNOSIS — N912 Amenorrhea, unspecified: Secondary | ICD-10-CM | POA: Diagnosis not present

## 2020-02-06 DIAGNOSIS — Z13220 Encounter for screening for lipoid disorders: Secondary | ICD-10-CM | POA: Diagnosis not present

## 2020-02-06 DIAGNOSIS — Z915 Personal history of self-harm: Secondary | ICD-10-CM

## 2020-02-06 DIAGNOSIS — K5900 Constipation, unspecified: Secondary | ICD-10-CM

## 2020-02-06 DIAGNOSIS — Z9151 Personal history of suicidal behavior: Secondary | ICD-10-CM | POA: Insufficient documentation

## 2020-02-06 LAB — POCT URINALYSIS DIPSTICK
Bilirubin, UA: NEGATIVE
Blood, UA: NEGATIVE
Glucose, UA: NEGATIVE
Ketones, UA: NEGATIVE
Nitrite, UA: NEGATIVE
Protein, UA: NEGATIVE
Spec Grav, UA: <=1.005 — AB (ref 1.010–1.025)
Urobilinogen, UA: 0.2 E.U./dL
pH, UA: 8 (ref 5.0–8.0)

## 2020-02-06 LAB — POCT URINE PREGNANCY: Preg Test, Ur: NEGATIVE

## 2020-02-06 NOTE — Progress Notes (Signed)
Send for culture

## 2020-02-06 NOTE — Patient Instructions (Signed)
Constipation, Adult Constipation is when a person: Poops (has a bowel movement) fewer times in a week than normal. Has a hard time pooping. Has poop that is dry, hard, or bigger than normal. Follow these instructions at home: Eating and drinking  Eat foods that have a lot of fiber, such as: Fresh fruits and vegetables. Whole grains. Beans. Eat less of foods that are high in fat, low in fiber, or overly processed, such as: [place] fries. Hamburgers. Cookies. Candy. Soda. Drink enough fluid to keep your pee (urine) clear or pale yellow. General instructions Exercise regularly or as told by your doctor. Go to the restroom when you feel like you need to poop. Do not hold it in. Take over-the-counter and prescription medicines only as told by your doctor. These include any fiber supplements. Do pelvic floor retraining exercises, such as: Doing deep breathing while relaxing your lower belly (abdomen). Relaxing your pelvic floor while pooping. Watch your condition for any changes. Keep all follow-up visits as told by your doctor. This is important. Contact a doctor if: You have pain that gets worse. You have a fever. You have not pooped for 4 days. You throw up (vomit). You are not hungry. You lose weight. You are bleeding from the anus. You have thin, pencil-like poop (stool). Get help right away if: You have a fever, and your symptoms suddenly get worse. You leak poop or have blood in your poop. Your belly feels hard or bigger than normal (is bloated). You have very bad belly pain. You feel dizzy or you faint. This information is not intended to replace advice given to you by your health care provider. Make sure you discuss any questions you have with your health care provider. Document Revised: 09/22/2017 Document Reviewed: 03/30/2016 Elsevier Patient Education  2020 [employer]. Health Maintenance, Female Adopting a healthy lifestyle and getting preventive care are  important in promoting health and wellness. Ask your health care provider about:  The right schedule for you to have regular tests and exams.  Things you can do on your own to prevent diseases and keep yourself healthy. What should I know about diet, weight, and exercise? Eat a healthy diet   Eat a diet that includes plenty of vegetables, fruits, low-fat dairy products, and lean protein.  Do not eat a lot of foods that are high in solid fats, added sugars, or sodium. Maintain a healthy weight Body mass index (BMI) is used to identify weight problems. It estimates body fat based on height and weight. Your health care provider can help determine your BMI and help you achieve or maintain a healthy weight. Get regular exercise Get regular exercise. This is one of the most important things you can do for your health. Most adults should:  Exercise for at least 150 minutes each week. The exercise should increase your heart rate and make you sweat (moderate-intensity exercise).  Do strengthening exercises at least twice a week. This is in addition to the moderate-intensity exercise.  Spend less time sitting. Even light physical activity can be beneficial. Watch cholesterol and blood lipids Have your blood tested for lipids and cholesterol at 28 years of age, then have this test every 5 years. Have your cholesterol levels checked more often if:  Your lipid or cholesterol levels are high.  You are older than 28 years of age.  You are at high risk for heart disease. What should I know about cancer screening? Depending on your health history and family history, you  may need to have cancer screening at various ages. This may include screening for:  Breast cancer.  Cervical cancer.  Colorectal cancer.  Skin cancer.  Lung cancer. What should I know about heart disease, diabetes, and high blood pressure? Blood pressure and heart disease  High blood pressure causes heart disease and  increases the risk of stroke. This is more likely to develop in people who have high blood pressure readings, are of African descent, or are overweight.  Have your blood pressure checked: ? Every 3-5 years if you are 34-12 years of age. ? Every year if you are 56 years old or older. Diabetes Have regular diabetes screenings. This checks your fasting blood sugar level. Have the screening done:  Once every three years after age 22 if you are at a normal weight and have a low risk for diabetes.  More often and at a younger age if you are overweight or have a high risk for diabetes. What should I know about preventing infection? Hepatitis B If you have a higher risk for hepatitis B, you should be screened for this virus. Talk with your health care provider to find out if you are at risk for hepatitis B infection. Hepatitis C Testing is recommended for:  Everyone born from [birth year] through 1964.  Anyone with known risk factors for hepatitis C. Sexually transmitted infections (STIs)  Get screened for STIs, including gonorrhea and chlamydia, if: ? You are sexually active and are younger than 28 years of age. ? You are older than 28 years of age and your health care provider tells you that you are at risk for this type of infection. ? Your sexual activity has changed since you were last screened, and you are at increased risk for chlamydia or gonorrhea. Ask your health care provider if you are at risk.  Ask your health care provider about whether you are at high risk for HIV. Your health care provider may recommend a prescription medicine to help prevent HIV infection. If you choose to take medicine to prevent HIV, you should first get tested for HIV. You should then be tested every 3 months for as long as you are taking the medicine. Pregnancy  If you are about to stop having your period (premenopausal) and you may become pregnant, seek counseling before you get pregnant.  Take 400 to 800  micrograms (mcg) of folic acid every day if you become pregnant.  Ask for birth control (contraception) if you want to prevent pregnancy. Osteoporosis and menopause Osteoporosis is a disease in which the bones lose minerals and strength with aging. This can result in bone fractures. If you are 46 years old or older, or if you are at risk for osteoporosis and fractures, ask your health care provider if you should:  Be screened for bone loss.  Take a calcium or vitamin D supplement to lower your risk of fractures.  Be given hormone replacement therapy (HRT) to treat symptoms of menopause. Follow these instructions at home: Lifestyle  Do not use any products that contain nicotine or tobacco, such as cigarettes, e-cigarettes, and chewing tobacco. If you need help quitting, ask your health care provider.  Do not use street drugs.  Do not share needles.  Ask your health care provider for help if you need support or information about quitting drugs. Alcohol use  Do not drink alcohol if: ? Your health care provider tells you not to drink. ? You are pregnant, may be pregnant, or are planning  to become pregnant.  If you drink alcohol: ? Limit how much you use to 0-1 drink a day. ? Limit intake if you are breastfeeding.  Be aware of how much alcohol is in your drink. In the [place] one drink equals one 12 oz bottle of beer (355 mL), one 5 oz glass of wine (148 mL), or one 1 oz glass of hard liquor (44 mL). General instructions  Schedule regular health, dental, and eye exams.  Stay current with your vaccines.  Tell your health care provider if: ? You often feel depressed. ? You have ever been abused or do not feel safe at home. Summary  Adopting a healthy lifestyle and getting preventive care are important in promoting health and wellness.  Follow your health care provider's instructions about healthy diet, exercising, and getting tested or screened for diseases.  Follow your health  care provider's instructions on monitoring your cholesterol and blood pressure. This information is not intended to replace advice given to you by your health care provider. Make sure you discuss any questions you have with your health care provider. Document Revised: 10/03/2018 Document Reviewed: 10/03/2018 Elsevier Patient Education  2020 Elsevier Inc. Psyllium oral capsule What is this medicine? PSYLLIUM (SIL i yum) is a bulk-forming fiber laxative. This medicine is used to treat constipation. Increasing fiber in the diet may also help lower cholesterol and promote heart health for some people. This medicine may be used for other purposes; ask your health care provider or pharmacist if you have questions. COMMON BRAND NAME(S): GenFiber, Konsyl, Metamucil, Metamucil MultiHealth, Natural Fiber Laxative, Reguloid What should I tell my health care provider before I take this medicine? They need to know if you have any of these conditions:  blockage in your bowel  difficulty swallowing  inflammatory bowel disease  stomach or intestine problems  sudden change in bowel habits lasting more than 2 weeks  an unusual or allergic reaction to psyllium, other medicines, dyes, or preservatives  pregnant or trying or get pregnant  breast-feeding How should I use this medicine? Take this medicine by mouth with a full glass of water. Follow the directions on the package labeling, or take as directed by your health care professional. Take your medicine at regular intervals. Do not take your medicine more often than directed. Talk to your pediatrician regarding the use of this medicine in children. While this drug may be prescribed for children as young as 26 years of age for selected conditions, precautions do apply. Overdosage: If you think you have taken too much of this medicine contact a poison control center or emergency room at once. NOTE: This medicine is only for you. Do not share this  medicine with others. What if I miss a dose? If you miss a dose, take it as soon as you can. If it is almost time for your next dose, take only that dose. Do not take double or extra doses. What may interact with this medicine? Interactions are not expected. Take this product at least 2 hours before or after other medicines. This list may not describe all possible interactions. Give your health care provider a list of all the medicines, herbs, non-prescription drugs, or dietary supplements you use. Also tell them if you smoke, drink alcohol, or use illegal drugs. Some items may interact with your medicine. What should I watch for while using this medicine? Check with your doctor or health care professional if your symptoms do not start to get better or if they  get worse. Stop using this medicine and contact your doctor or health care professional if you have rectal bleeding or if you have to treat your constipation for more than 1 week. These could be signs of a more serious condition. Drink several glasses of water a day while you are taking this medicine. This will help to relieve constipation and prevent dehydration. What side effects may I notice from receiving this medicine? Side effects that you should report to your doctor or health care professional as soon as possible:  allergic reactions like skin rash, itching or hives, swelling of the face, lips, or tongue  breathing problems  chest pain  nausea, vomiting  rectal bleeding  trouble swallowing Side effects that usually do not require medical attention (report to your doctor or health care professional if they continue or are bothersome):  bloating  gas  stomach cramps This list may not describe all possible side effects. Call your doctor for medical advice about side effects. You may report side effects to FDA at 1-800-FDA-1088. Where should I keep my medicine? Keep out of the reach of children. Store at room temperature  between 15 and 30 degrees C (59 and 86 degrees F). Protect from moisture. Throw away any unused medicine after the expiration date. NOTE: This sheet is a summary. It may not cover all possible information. If you have questions about this medicine, talk to your doctor, pharmacist, or health care provider.  2020 Elsevier/Gold Standard (2018-03-06 15:56:42)

## 2020-02-07 ENCOUNTER — Other Ambulatory Visit: Payer: Self-pay

## 2020-02-07 DIAGNOSIS — R82998 Other abnormal findings in urine: Secondary | ICD-10-CM | POA: Diagnosis not present

## 2020-02-07 DIAGNOSIS — N912 Amenorrhea, unspecified: Secondary | ICD-10-CM | POA: Diagnosis not present

## 2020-02-07 DIAGNOSIS — Z1329 Encounter for screening for other suspected endocrine disorder: Secondary | ICD-10-CM | POA: Diagnosis not present

## 2020-02-07 DIAGNOSIS — Z113 Encounter for screening for infections with a predominantly sexual mode of transmission: Secondary | ICD-10-CM | POA: Diagnosis not present

## 2020-02-07 DIAGNOSIS — Z0000 Encounter for general adult medical examination without abnormal findings: Secondary | ICD-10-CM | POA: Diagnosis not present

## 2020-02-07 DIAGNOSIS — Z13220 Encounter for screening for lipoid disorders: Secondary | ICD-10-CM | POA: Diagnosis not present

## 2020-02-08 LAB — COMPREHENSIVE METABOLIC PANEL
ALT: 41 IU/L — ABNORMAL HIGH (ref 0–32)
AST: 94 IU/L — ABNORMAL HIGH (ref 0–40)
Albumin/Globulin Ratio: 1.6 (ref 1.2–2.2)
Albumin: 4.6 g/dL (ref 3.9–5.0)
Alkaline Phosphatase: 77 IU/L (ref 39–117)
BUN/Creatinine Ratio: 16 (ref 9–23)
BUN: 15 mg/dL (ref 6–20)
Bilirubin Total: 0.2 mg/dL (ref 0.0–1.2)
CO2: 23 mmol/L (ref 20–29)
Calcium: 9 mg/dL (ref 8.7–10.2)
Chloride: 100 mmol/L (ref 96–106)
Creatinine, Ser: 0.93 mg/dL (ref 0.57–1.00)
GFR calc Af Amer: 97 mL/min/{1.73_m2} (ref >59)
GFR calc non Af Amer: 84 mL/min/{1.73_m2} (ref >59)
Globulin, Total: 2.9 g/dL (ref 1.5–4.5)
Glucose: 86 mg/dL (ref 65–99)
Potassium: 4.3 mmol/L (ref 3.5–5.2)
Sodium: 139 mmol/L (ref 134–144)
Total Protein: 7.5 g/dL (ref 6.0–8.5)

## 2020-02-08 LAB — LIPID PANEL W/O CHOL/HDL RATIO
Cholesterol, Total: 208 mg/dL — ABNORMAL HIGH (ref 100–199)
HDL: 60 mg/dL (ref >39)
LDL Chol Calc (NIH): 130 mg/dL — ABNORMAL HIGH (ref 0–99)
Triglycerides: 99 mg/dL (ref 0–149)
VLDL Cholesterol Cal: 18 mg/dL (ref 5–40)

## 2020-02-08 LAB — CBC WITH DIFFERENTIAL/PLATELET
Basophils Absolute: 0.1 10*3/uL (ref 0.0–0.2)
Basos: 1 %
EOS (ABSOLUTE): 0.1 10*3/uL (ref 0.0–0.4)
Eos: 1 %
Hematocrit: 42.1 % (ref 34.0–46.6)
Hemoglobin: 13.9 g/dL (ref 11.1–15.9)
Immature Grans (Abs): 0 10*3/uL (ref 0.0–0.1)
Immature Granulocytes: 0 %
Lymphocytes Absolute: 2.7 10*3/uL (ref 0.7–3.1)
Lymphs: 37 %
MCH: 29.4 pg (ref 26.6–33.0)
MCHC: 33 g/dL (ref 31.5–35.7)
MCV: 89 fL (ref 79–97)
Monocytes Absolute: 0.5 10*3/uL (ref 0.1–0.9)
Monocytes: 7 %
Neutrophils Absolute: 4 10*3/uL (ref 1.4–7.0)
Neutrophils: 54 %
Platelets: 273 10*3/uL (ref 150–450)
RBC: 4.73 x10E6/uL (ref 3.77–5.28)
RDW: 13.5 % (ref 11.7–15.4)
WBC: 7.5 10*3/uL (ref 3.4–10.8)

## 2020-02-08 LAB — URINE CULTURE: Organism ID, Bacteria: NO GROWTH

## 2020-02-08 LAB — HCG, SERUM, QUALITATIVE: hCG,Beta Subunit,Qual,Serum: NEGATIVE m[IU]/mL (ref <6)

## 2020-02-08 LAB — TSH: TSH: 2.84 u[IU]/mL (ref 0.450–4.500)

## 2020-02-08 LAB — HEPATITIS PANEL, ACUTE
Hep A IgM: NEGATIVE
Hep B C IgM: NEGATIVE
Hep C Virus Ab: <0.1 s/co ratio (ref 0.0–0.9)
Hepatitis B Surface Ag: NEGATIVE

## 2020-02-08 LAB — HIV ANTIBODY (ROUTINE TESTING W REFLEX): HIV Screen 4th Generation wRfx: NONREACTIVE

## 2020-02-08 LAB — RPR: RPR Ser Ql: NONREACTIVE

## 2020-02-10 NOTE — Progress Notes (Signed)
Hepatitis panel is negative for A, B, and C    HIV screening is non reactive/ negative  CBC within normal limits/  CMP liver enzymes are elevated, avoid tylenol/ alcohol. Total cholesterol and LDL elevated.  Discuss lifestyle modification with patient e.g. increase exercise, fiber, fruits, vegetables, lean meat, and omega 3/fish intake and decrease saturated fat.  If patient following strict diet and exercise program already please schedule follow up appointment with primary care physician Serum pregnancy test is negative. Repeat if clinically indicated.  Urine culture negative.  Repeat CMP in 3 months- please add lab for diagnosis elevated liver enzymes.

## 2020-02-11 ENCOUNTER — Encounter: Payer: Self-pay | Admitting: Adult Health

## 2020-02-12 LAB — URINE CYTOLOGY ANCILLARY ONLY
Bacterial Vaginitis-Urine: POSITIVE — AB
Bacterial Vaginitis-Urine: POSITIVE — AB
Bacterial Vaginitis-Urine: POSITIVE — AB
Bacterial Vaginitis-Urine: POSITIVE — AB
Candida Urine: NEGATIVE
Candida vaginitis: NEGATIVE
Chlamydia: NEGATIVE
Comment: NEGATIVE
Comment: NEGATIVE
Comment: NORMAL
Neisseria Gonorrhea: NEGATIVE
Trichomonas: NEGATIVE

## 2020-02-28 ENCOUNTER — Encounter: Payer: Self-pay | Admitting: Adult Health

## 2020-03-17 ENCOUNTER — Ambulatory Visit (INDEPENDENT_AMBULATORY_CARE_PROVIDER_SITE_OTHER): Payer: 59 | Admitting: Certified Nurse Midwife

## 2020-03-17 ENCOUNTER — Other Ambulatory Visit: Payer: Self-pay

## 2020-03-17 ENCOUNTER — Encounter: Payer: Self-pay | Admitting: Certified Nurse Midwife

## 2020-03-17 VITALS — BP 110/68 | HR 70 | Ht 61.0 in | Wt 139.2 lb

## 2020-03-17 DIAGNOSIS — N926 Irregular menstruation, unspecified: Secondary | ICD-10-CM

## 2020-03-17 LAB — POCT URINE PREGNANCY: Preg Test, Ur: NEGATIVE

## 2020-03-17 MED ORDER — NORETHINDRONE ACET-ETHINYL EST 1.5-30 MG-MCG PO TABS: 1.0000 | ORAL_TABLET | Freq: Every day | ORAL | 11 refills | Status: DC

## 2020-03-17 NOTE — Patient Instructions (Signed)
Dysfunctional Uterine Bleeding Dysfunctional uterine bleeding is abnormal bleeding from the uterus. Dysfunctional uterine bleeding includes:  A menstrual period that comes earlier or later than usual.  A menstrual period that is lighter or heavier than usual, or has large blood clots.  Vaginal bleeding between menstrual periods.  Skipping one or more menstrual periods.  Vaginal bleeding after sex.  Vaginal bleeding after menopause. Follow these instructions at home: Eating and drinking   Eat well-balanced meals. Include foods that are high in iron, such as liver, meat, shellfish, green leafy vegetables, and eggs.  To prevent or treat constipation, your health care provider may recommend that you: ? Drink enough fluid to keep your urine pale yellow. ? Take over-the-counter or prescription medicines. ? Eat foods that are high in fiber, such as beans, whole grains, and fresh fruits and vegetables. ? Limit foods that are high in fat and processed sugars, such as fried or sweet foods. Medicines  Take over-the-counter and prescription medicines only as told by your health care provider.  Do not change medicines without talking with your health care provider.  Aspirin or medicines that contain aspirin may make the bleeding worse. Do not take those medicines: ? During the week before your menstrual period. ? During your menstrual period.  If you were prescribed iron pills, take them as told by your health care provider. Iron pills help to replace iron that your body loses because of this condition. Activity  If you need to change your sanitary pad or tampon more than one time every 2 hours: ? Lie in bed with your feet raised (elevated). ? Place a cold pack on your lower abdomen. ? Rest as much as possible until the bleeding stops or slows down.  Do not try to lose weight until the bleeding has stopped and your blood iron level is back to normal. General instructions   For two  months, write down: ? When your menstrual period starts. ? When your menstrual period ends. ? When any abnormal vaginal bleeding occurs. ? What problems you notice.  Keep all follow up visits as told by your health care provider. This is important. Contact a health care provider if you:  Feel light-headed or weak.  Have nausea and vomiting.  Cannot eat or drink without vomiting.  Feel dizzy or have diarrhea while you are taking medicines.  Are taking birth control pills or hormones, and you want to change them or stop taking them. Get help right away if:  You develop a fever or chills.  You need to change your sanitary pad or tampon more than one time per hour.  Your vaginal bleeding becomes heavier, or your flow contains clots more often.  You develop pain in your abdomen.  You lose consciousness.  You develop a rash. Summary  Dysfunctional uterine bleeding is abnormal bleeding from the uterus.  It includes menstrual bleeding of abnormal duration, volume, or regularity.  Bleeding after sex and after menopause are also considered dysfunctional uterine bleeding. This information is not intended to replace advice given to you by your health care provider. Make sure you discuss any questions you have with your health care provider. Document Revised: 03/21/2018 Document Reviewed: 03/21/2018 Elsevier Patient Education  2020 Elsevier Inc.  

## 2020-03-17 NOTE — Progress Notes (Signed)
GYN ENCOUNTER NOTE  Subjective:       Lori Dixon is a 29 y.o. [id number] female is here for gynecologic evaluation of the following issues:  1. PT states she had missed her period from February until this past month. She denies any history of this in the past. .  She admits to having weight gain over the years. She is not currently on birth control    Gynecologic History Patient's last menstrual period was 03/07/2020 (exact date). Contraception: none Last Pap: several yrs ago  Last mammogram: n/a  Obstetric History OB History  Gravida Para Term Preterm AB Living  3 3 3     3   SAB TAB Ectopic Multiple Live Births          3    # Outcome Date GA Lbr Len/2nd Weight Sex Delivery Anes PTL Lv  3 Term 05/26/19   7 lb 8 oz (3.402 kg) F Vag-Spont  N LIV  2 Term 05/13/17   8 lb 2 oz (3.685 kg) F Vag-Spont  N LIV  1 Term 10/05/12   7 lb 3 oz (3.26 kg) F Vag-Spont  N LIV    Past Medical History:  Diagnosis Date  . Anxiety   . Depression   . Heart murmur    AS A CHILD  . Leaky heart valve     Past Surgical History:  Procedure Laterality Date  . HYSTEROSCOPY N/A 02/04/2016   Procedure: HYSTEROSCOPY WITH MYOSURE;  Surgeon: 02/06/2016, MD;  Location: [facility];  Service: Gynecology;  Laterality: N/A;  . LAPAROSCOPY N/A 02/04/2016   Procedure: LAPAROSCOPY DIAGNOSTIC;  Surgeon: 02/06/2016, MD;  Location: [facility];  Service: Gynecology;  Laterality: N/A;  . NO PAST SURGERIES      Current Outpatient Medications on File Prior to Visit  Medication Sig Dispense Refill  . Multiple Vitamin (MULTIVITAMIN) capsule Take 1 capsule by mouth daily.    . polycarbophil (FIBERCON) 625 MG tablet Take 625 mg by mouth daily.     No current facility-administered medications on file prior to visit.    No Known Allergies  Social History   Socioeconomic History  . Marital status: Married    Spouse name: Not on file  . Number of children: Not on file  . Years of education: Not on file   . Highest education level: Not on file  Occupational History  . Not on file  Tobacco Use  . Smoking status: Never Smoker  . Smokeless tobacco: Never Used  Substance and Sexual Activity  . Alcohol use: Yes    Comment: OCC  . Drug use: No  . Sexual activity: Not Currently    Birth control/protection: None  Other Topics Concern  . Not on file  Social History Narrative  . Not on file   Social Determinants of Health   Financial Resource Strain:   . Difficulty of Paying Living Expenses:   Food Insecurity:   . Worried About [place] in the Last Year:   . Programme researcher, broadcasting/film/video in the Last Year:   Transportation Needs:   . Barista (Medical):   Freight forwarder Lack of Transportation (Non-Medical):   Physical Activity:   . Days of Exercise per Week:   . Minutes of Exercise per Session:   Stress:   . Feeling of Stress :   Social Connections:   . Frequency of Communication with Friends and Family:   . Frequency of Social Gatherings with Friends and Family:   .  Attends Religious Services:   . Active Member of Clubs or Organizations:   . Attends Archivist Meetings:   Marland Kitchen Marital Status:   Intimate Partner Violence:   . Fear of Current or Ex-Partner:   . Emotionally Abused:   Marland Kitchen Physically Abused:   . Sexually Abused:     Family History  Problem Relation Age of Onset  . Melanoma Mother   . Hypertension Maternal Grandfather   . Hyperlipidemia Maternal Grandfather     The following portions of the patient's history were reviewed and updated as appropriate: allergies, current medications, past family history, past medical history, past social history, past surgical history and problem list.  Review of Systems Review of Systems - Negative except as mentioned in HPI Review of Systems - General ROS: negative for - chills, fatigue, fever, hot flashes, malaise or night sweats Hematological and Lymphatic ROS: negative for - bleeding problems or swollen lymph  nodes Gastrointestinal ROS: negative for - abdominal pain, blood in stools, change in bowel habits and nausea/vomiting Musculoskeletal ROS: negative for - joint pain, muscle pain or muscular weakness Genito-Urinary ROS: negative for - change in menstrual cycle, dysmenorrhea, dyspareunia, dysuria, genital discharge, genital ulcers, hematuria, incontinence, heavy menses, nocturia or pelvic pain. Positive for  Missed menses.   Objective:   BP 110/68   Pulse 70   Ht 5\' 1"  (1.549 m)   Wt 139 lb 3 oz (63.1 kg)   LMP 03/07/2020 (Exact Date)   BMI 26.30 kg/m  CONSTITUTIONAL: Well-developed, well-nourished female in no acute distress.  HENT:  Normocephalic, atraumatic.  NECK: Normal range of motion, supple, no masses.  Normal thyroid.  SKIN: Skin is warm and dry. No rash noted. Not diaphoretic. No erythema. No pallor. [place]: Alert and oriented to person, place, and time. PSYCHIATRIC: Normal mood and affect. Normal behavior. Normal judgment and thought content. CARDIOVASCULAR:Not Examined RESPIRATORY: Not Examined BREASTS: Not Examined ABDOMEN: Soft, non distended; Non tender.  No Organomegaly. PELVIC: deferred not needed  MUSCULOSKELETAL: Normal range of motion. No tenderness.  No cyanosis, clubbing, or edema.   Assessment:   1. Irregular periods  - FSH/LH - Prolactin - Testosterone - TSH - US PELVIC COMPLETE WITH TRANSVAGINAL; Future     Plan:   Discussed possible causes of missed menses. Labs completed today, Pelvic ultrasound ordered. She is interested in starting birth control . Discussed BC options including pill, depo injections, IUD. She was considering depo but is concerned about more weight gain. She would like to use the pill. She has used the pill in the past. She denies any contraindications to use. Pt encouraged to have u/s prior to starting pill. She will follow up for u/s and annual exam ( pap smear) 1 wk. Will follow up with lab results.  Philip Aspen, CNM

## 2020-03-17 NOTE — Addendum Note (Signed)
Addended by: Brooke Dare on: 03/17/2020 03:53 PM   Modules accepted: Orders

## 2020-03-18 LAB — TSH: TSH: 1.97 u[IU]/mL (ref 0.450–4.500)

## 2020-03-18 LAB — FSH/LH
FSH: 5 m[IU]/mL
LH: 17.9 m[IU]/mL

## 2020-03-18 LAB — PROLACTIN: Prolactin: 28 ng/mL — ABNORMAL HIGH (ref 4.8–23.3)

## 2020-03-18 LAB — TESTOSTERONE: Testosterone: 34 ng/dL (ref 13–71)

## 2020-03-25 ENCOUNTER — Other Ambulatory Visit: Payer: Self-pay

## 2020-03-25 ENCOUNTER — Encounter: Payer: Self-pay | Admitting: Certified Nurse Midwife

## 2020-03-25 ENCOUNTER — Other Ambulatory Visit (HOSPITAL_COMMUNITY)
Admission: RE | Admit: 2020-03-25 | Discharge: 2020-03-25 | Disposition: A | Discharge status: Home / Self Care | Payer: 59 | Source: Ambulatory Visit | Attending: Certified Nurse Midwife | Admitting: Certified Nurse Midwife

## 2020-03-25 ENCOUNTER — Ambulatory Visit (INDEPENDENT_AMBULATORY_CARE_PROVIDER_SITE_OTHER): Payer: 59

## 2020-03-25 ENCOUNTER — Ambulatory Visit (INDEPENDENT_AMBULATORY_CARE_PROVIDER_SITE_OTHER): Payer: 59 | Admitting: Certified Nurse Midwife

## 2020-03-25 VITALS — BP 105/70 | HR 69 | Ht 61.0 in | Wt 140.6 lb

## 2020-03-25 DIAGNOSIS — Z124 Encounter for screening for malignant neoplasm of cervix: Secondary | ICD-10-CM | POA: Insufficient documentation

## 2020-03-25 DIAGNOSIS — Z01419 Encounter for gynecological examination (general) (routine) without abnormal findings: Secondary | ICD-10-CM

## 2020-03-25 DIAGNOSIS — E282 Polycystic ovarian syndrome: Secondary | ICD-10-CM | POA: Insufficient documentation

## 2020-03-25 DIAGNOSIS — N926 Irregular menstruation, unspecified: Secondary | ICD-10-CM

## 2020-03-25 NOTE — Addendum Note (Signed)
Addended by: Mechele Claude on: 03/25/2020 02:00 PM   Modules accepted: Orders

## 2020-03-25 NOTE — Progress Notes (Signed)
GYNECOLOGY ANNUAL PREVENTATIVE CARE ENCOUNTER NOTE  History:     Lori Dixon is a 29 y.o. [id number] female here for a routine annual gynecologic exam.  Current complaints: none. U/s today for irregular periods   Denies abnormal vaginal bleeding, discharge, pelvic pain, problems with intercourse or other gynecologic concerns.      Social Relationship:seperated Living:with parents and her 3 children Work:[employer]  Exercise:irregular  Smoke/Vape:none Drugs:none Alcohol: occasional   Gynecologic History Patient's last menstrual period was 03/07/2020 (exact date). Contraception: OCP (estrogen/progesterone) Last Pap: a few yrs ago per pt. Results were:denies hx abnormal pap Last mammogram: n/a  Obstetric History OB History  Gravida Para Term Preterm AB Living  3 3 3     3   SAB TAB Ectopic Multiple Live Births          3    # Outcome Date GA Lbr Len/2nd Weight Sex Delivery Anes PTL Lv  3 Term 05/26/19   7 lb 8 oz (3.402 kg) F Vag-Spont  N LIV  2 Term 05/13/17   8 lb 2 oz (3.685 kg) F Vag-Spont  N LIV  1 Term 10/05/12   7 lb 3 oz (3.26 kg) F Vag-Spont  N LIV    Past Medical History:  Diagnosis Date  . Anxiety   . Depression   . Heart murmur    AS A CHILD  . Leaky heart valve     Past Surgical History:  Procedure Laterality Date  . HYSTEROSCOPY N/A 02/04/2016   Procedure: HYSTEROSCOPY WITH MYOSURE;  Surgeon: 02/06/2016, MD;  Location: [facility];  Service: Gynecology;  Laterality: N/A;  . LAPAROSCOPY N/A 02/04/2016   Procedure: LAPAROSCOPY DIAGNOSTIC;  Surgeon: 02/06/2016, MD;  Location: [facility];  Service: Gynecology;  Laterality: N/A;  . NO PAST SURGERIES      Current Outpatient Medications on File Prior to Visit  Medication Sig Dispense Refill  . Multiple Vitamin (MULTIVITAMIN) capsule Take 1 capsule by mouth daily.    . Norethindrone Acetate-Ethinyl Estradiol (LOESTRIN) 1.5-30 MG-MCG tablet Take 1 tablet by mouth daily. 1 Package  11  . polycarbophil (FIBERCON) 625 MG tablet Take 625 mg by mouth daily.     No current facility-administered medications on file prior to visit.    No Known Allergies  Social History:  reports that she has never smoked. She has never used smokeless tobacco. She reports current alcohol use. She reports that she does not use drugs.  Family History  Problem Relation Age of Onset  . Melanoma Mother   . Hypertension Maternal Grandfather   . Hyperlipidemia Maternal Grandfather     The following portions of the patient's history were reviewed and updated as appropriate: allergies, current medications, past family history, past medical history, past social history, past surgical history and problem list.  Review of Systems Pertinent items noted in HPI and remainder of comprehensive ROS otherwise negative.  Physical Exam:  LMP 03/07/2020 (Exact Date)  CONSTITUTIONAL: Well-developed, well-nourished female in no acute distress.  HENT:  Normocephalic, atraumatic, External right and left ear normal. Oropharynx is clear and moist EYES: Conjunctivae and EOM are normal. Pupils are equal, round, and reactive to light. No scleral icterus.  NECK: Normal range of motion, supple, no masses.  Normal thyroid.  SKIN: Skin is warm and dry. No rash noted. Not diaphoretic. No erythema. No pallor. MUSCULOSKELETAL: Normal range of motion. No tenderness.  No cyanosis, clubbing, or edema.  2+ distal pulses. NEUROLOGIC: Alert  and oriented to person, place, and time. Normal reflexes, muscle tone coordination.  PSYCHIATRIC: Normal mood and affect. Normal behavior. Normal judgment and thought content. CARDIOVASCULAR: Normal heart rate noted, regular rhythm RESPIRATORY: Clear to auscultation bilaterally. Effort and breath sounds normal, no problems with respiration noted. BREASTS: Symmetric in size. No masses, tenderness, skin changes, nipple drainage, or lymphadenopathy bilaterally. Performed in the presence of a  chaperone. ABDOMEN: Soft, no distention noted.  No tenderness, rebound or guarding.  PELVIC: Normal appearing external genitalia and urethral meatus; normal appearing vaginal mucosa and cervix.  No abnormal discharge noted.  Pap smear obtained.  Normal uterine size, no other palpable masses, no uterine or adnexal tenderness.  Performed in the presence of a chaperone.  Patient Name: Lori Dixon DOB: 05-Apr-1991 MRN: [id number] ULTRASOUND REPORT  Location: [facility]  Date of Service: 03/25/2020     Indications:AUB Findings:  The uterus is anteverted and measures 8.6 x 4.7 x 4.7 cm. Echo texture is homogenous without evidence of focal masses.  The Endometrium measures 13.6 mm.  Right Ovary measures 4.2 x 3.6 x 2.6 cm. It is normal in appearance. Multiple tiny follicles  measuring 2-7 mm in diameter, Left Ovary measures 4.2 x 2.6 x 3.3 cm. It is normal in appearance.Multiple tiny follicles  measuring 2-7 mm in diameter, Survey of the adnexa demonstrates no adnexal masses. There is no free fluid in the cul de sac.  Impression: 1. Ovaries slightly enlarged bilaterally. 2. Multiple follicles seen with-in both ovaries as described above. 3.Survey of the adnexa demonstrates no adnexal masses.  Recommendations: 1.Clinical correlation with the patient's History and Physical Exam.   Lori Dixon       Assessment and Plan:    1. Women's annual routine gynecological examination  Will follow up results of pap smear and manage accordingly. Mammogram n/a Labs:none Refill:none Referral:none Routine preventative health maintenance measures emphasized. Please refer to After Visit Summary for other counseling recommendations.      Philip Aspen, CNM

## 2020-03-25 NOTE — Patient Instructions (Signed)
Polycystic Ovarian Syndrome  Polycystic ovarian syndrome (PCOS) is a common hormonal disorder among women of reproductive age. In most women with PCOS, many small fluid-filled sacs (cysts) grow on the ovaries, and the cysts are not part of a normal menstrual cycle. PCOS can cause problems with your menstrual periods and make it difficult to get pregnant. It can also cause an increased risk of miscarriage with pregnancy. If it is not treated, PCOS can lead to serious health problems, such as diabetes and heart disease. What are the causes? The cause of PCOS is not known, but it may be the result of a combination of certain factors, such as:  Irregular menstrual cycle.  High levels of certain hormones (androgens).  Problems with the hormone that helps to control blood sugar (insulin resistance).  Certain genes. What increases the risk? This condition is more likely to develop in women who have a family history of PCOS. What are the signs or symptoms? Symptoms of PCOS may include:  Multiple ovarian cysts.  Infrequent periods or no periods.  Periods that are too frequent or too heavy.  Unpredictable periods.  Inability to get pregnant (infertility) because of not ovulating.  Increased growth of hair on the face, chest, stomach, back, thumbs, thighs, or toes.  Acne or oily skin. Acne may develop during adulthood, and it may not respond to treatment.  Pelvic pain.  Weight gain or obesity.  Patches of thickened and dark brown or black skin on the neck, arms, breasts, or thighs (acanthosis nigricans).  Excess hair growth on the face, chest, abdomen, or upper thighs (hirsutism). How is this diagnosed? This condition is diagnosed based on:  Your medical history.  A physical exam, including a pelvic exam. Your health care provider may look for areas of increased hair growth on your skin.  Tests, such as: ? Ultrasound. This may be used to examine the ovaries and the lining of the  uterus (endometrium) for cysts. ? Blood tests. These may be used to check levels of sugar (glucose), female hormone (testosterone), and female hormones (estrogen and progesterone) in your blood. How is this treated? There is no cure for PCOS, but treatment can help to manage symptoms and prevent more health problems from developing. Treatment varies depending on:  Your symptoms.  Whether you want to have a baby or whether you need birth control (contraception). Treatment may include nutrition and lifestyle changes along with:  Progesterone hormone to start a menstrual period.  Birth control pills to help you have regular menstrual periods.  Medicines to make you ovulate, if you want to get pregnant.  Medicine to reduce excessive hair growth.  Surgery, in severe cases. This may involve making small holes in one or both of your ovaries. This decreases the amount of testosterone that your body produces. Follow these instructions at home:  Take over-the-counter and prescription medicines only as told by your health care provider.  Follow a healthy meal plan. This can help you reduce the effects of PCOS. ? Eat a healthy diet that includes lean proteins, complex carbohydrates, fresh fruits and vegetables, low-fat dairy products, and healthy fats. Make sure to eat enough fiber.  If you are overweight, lose weight as told by your health care provider. ? Losing 10% of your body weight may improve symptoms. ? Your health care provider can determine how much weight loss is best for you and can help you lose weight safely.  Keep all follow-up visits as told by your health care provider.   This is important. Contact a health care provider if:  Your symptoms do not get better with medicine.  You develop new symptoms. This information is not intended to replace advice given to you by your health care provider. Make sure you discuss any questions you have with your health care provider. Document  Revised: 09/22/2017 Document Reviewed: 03/27/2016 Elsevier Patient Education  2020 Elsevier Inc.  

## 2020-03-27 LAB — CYTOLOGY - PAP: Diagnosis: NEGATIVE

## 2020-04-21 ENCOUNTER — Encounter: Payer: Self-pay | Admitting: Adult Health

## 2020-04-22 ENCOUNTER — Other Ambulatory Visit: Payer: Self-pay | Admitting: Adult Health

## 2020-04-22 DIAGNOSIS — Z9151 Personal history of suicidal behavior: Secondary | ICD-10-CM

## 2020-04-22 DIAGNOSIS — F339 Major depressive disorder, recurrent, unspecified: Secondary | ICD-10-CM

## 2020-04-22 NOTE — Progress Notes (Signed)
Order for referral for mental health evaluation has been placed and marked urgent. KW

## 2020-05-06 ENCOUNTER — Ambulatory Visit: Payer: 59 | Admitting: Adult Health

## 2020-06-09 DIAGNOSIS — S93401A Sprain of unspecified ligament of right ankle, initial encounter: Secondary | ICD-10-CM | POA: Diagnosis not present

## 2020-07-06 ENCOUNTER — Ambulatory Visit (INDEPENDENT_AMBULATORY_CARE_PROVIDER_SITE_OTHER): Payer: 59 | Admitting: Family Medicine

## 2020-07-06 ENCOUNTER — Other Ambulatory Visit: Payer: Self-pay

## 2020-07-06 ENCOUNTER — Encounter: Payer: Self-pay | Admitting: Family Medicine

## 2020-07-06 VITALS — BP 123/79 | HR 80 | Temp 98.5°F | Ht 61.0 in | Wt 141.0 lb

## 2020-07-06 DIAGNOSIS — F339 Major depressive disorder, recurrent, unspecified: Secondary | ICD-10-CM | POA: Diagnosis not present

## 2020-07-06 DIAGNOSIS — Z23 Encounter for immunization: Secondary | ICD-10-CM

## 2020-07-06 DIAGNOSIS — R5382 Chronic fatigue, unspecified: Secondary | ICD-10-CM | POA: Diagnosis not present

## 2020-07-06 MED ORDER — NORETHINDRONE ACET-ETHINYL EST 1.5-30 MG-MCG PO TABS: 1.0000 | ORAL_TABLET | Freq: Every day | ORAL | 4 refills | Status: DC

## 2020-07-06 MED ORDER — ARIPIPRAZOLE 5 MG PO TABS: 5.0000 mg | ORAL_TABLET | Freq: Every day | ORAL | 3 refills | Status: DC

## 2020-07-06 NOTE — Progress Notes (Addendum)
BP 123/79   Pulse 80   Temp 98.5 F (36.9 C) (Oral)   Ht 5\' 1"  (1.549 m)   Wt 141 lb (64 kg)   SpO2 95%   BMI 26.64 kg/m    Subjective:    Patient ID: , female    DOB: 1991/07/22, 29 y.o.   MRN: [id number]  HPI: Lori Dixon is a 29 y.o. female  Chief Complaint  Patient presents with  . Establish Care  . Anxiety  . Depression   ANXIETY/DEPRESSION, had an attempted suicide attempt >8 years ago. Has taken zoloft in the past, didn't notice  Duration: Chronic, worse in the past few months Status:exacerbated Anxious mood: yes  Excessive worrying: yes Irritability: yes  Sweating: no Nausea: no Palpitations:yes Hyperventilation: yes Panic attacks: yes Agoraphobia: no  Obscessions/compulsions: no Depressed mood: yes Depression screen [facility] 2/9 07/14/2020 02/06/2020  Decreased Interest 2 2  Down, Depressed, Hopeless 2 2  PHQ - 2 Score 4 4  Altered sleeping 3 3  Tired, decreased energy 3 2  Change in appetite 0 0  Feeling bad or failure about yourself  2 3  Trouble concentrating 3 2  Moving slowly or fidgety/restless 0 2  Suicidal thoughts 1 1  PHQ-9 Score 16 17  Difficult doing work/chores Very difficult Very difficult   GAD 7 : Generalized Anxiety Score 07/14/2020  Nervous, Anxious, on Edge 3  Control/stop worrying 2  Worry too much - different things 2  Trouble relaxing 3  Restless 1  Easily annoyed or irritable 3  Afraid - awful might happen 1  Total GAD 7 Score 15  Anxiety Difficulty Very difficult    Anhedonia: no Weight changes: no Insomnia: yes hard to fall asleep  Hypersomnia: no Fatigue/loss of energy: yes Feelings of worthlessness: yes Feelings of guilt: no Impaired concentration/indecisiveness: no Suicidal ideations: no  Crying spells: no Recent Stressors/Life Changes: yes   Relationship problems: yes   Family stress: yes     Financial stress: yes    Job stress: yes    Recent death/loss: no  Active Ambulatory Problems     Diagnosis Date Noted  . Depression, recurrent (HCC) 02/06/2020  . History of suicide attempt 02/06/2020  . PCOS (polycystic ovarian syndrome) 03/25/2020   Resolved Ambulatory Problems    Diagnosis Date Noted  . Screening for STD (sexually transmitted disease) 10/24/2016  . Single umbilical artery affecting management of mother in singleton pregnancy, antepartum 12/31/2018  . Absence of menstruation 02/06/2020  . Leukocytes in urine 02/06/2020  . Irregular periods 03/17/2020   Past Medical History:  Diagnosis Date  . Anxiety   . Depression   . Heart murmur   . Leaky heart valve    Past Surgical History:  Procedure Laterality Date  . HYSTEROSCOPY N/A 02/04/2016   Procedure: HYSTEROSCOPY WITH MYOSURE;  Surgeon: 02/06/2016, MD;  Location: [facility];  Service: Gynecology;  Laterality: N/A;  . LAPAROSCOPY N/A 02/04/2016   Procedure: LAPAROSCOPY DIAGNOSTIC;  Surgeon: 02/06/2016, MD;  Location: [facility];  Service: Gynecology;  Laterality: N/A;  . NO PAST SURGERIES     Outpatient Encounter Medications as of 07/06/2020  Medication Sig  . Multiple Vitamin (MULTIVITAMIN) capsule Take 1 capsule by mouth daily.  . Norethindrone Acetate-Ethinyl Estradiol (LOESTRIN) 1.5-30 MG-MCG tablet Take 1 tablet by mouth daily. Take continuously for 3 months, then hold for a week, then restart  . [DISCONTINUED] Norethindrone Acetate-Ethinyl Estradiol (LOESTRIN) 1.5-30 MG-MCG tablet Take 1 tablet by mouth daily.  08-27-1978  ARIPiprazole (ABILIFY) 5 MG tablet Take 1 tablet (5 mg total) by mouth daily.  . [DISCONTINUED] polycarbophil (FIBERCON) 625 MG tablet Take 625 mg by mouth daily.   No facility-administered encounter medications on file as of 07/06/2020.   No Known Allergies  Social History   Socioeconomic History  . Marital status: Married    Spouse name: Not on file  . Number of children: Not on file  . Years of education: Not on file  . Highest education level: Not on file  Occupational  History  . Not on file  Tobacco Use  . Smoking status: Never Smoker  . Smokeless tobacco: Never Used  Vaping Use  . Vaping Use: Never used  Substance and Sexual Activity  . Alcohol use: Yes    Comment: OCC  . Drug use: No  . Sexual activity: Not Currently    Birth control/protection: None  Other Topics Concern  . Not on file  Social History Narrative  . Not on file   Social Determinants of Health   Financial Resource Strain:   . Difficulty of Paying Living Expenses: Not on file  Food Insecurity:   . Worried About Programme researcher, broadcasting/film/video in the Last Year: Not on file  . Ran Out of Food in the Last Year: Not on file  Transportation Needs:   . Lack of Transportation (Medical): Not on file  . Lack of Transportation (Non-Medical): Not on file  Physical Activity:   . Days of Exercise per Week: Not on file  . Minutes of Exercise per Session: Not on file  Stress:   . Feeling of Stress : Not on file  Social Connections:   . Frequency of Communication with Friends and Family: Not on file  . Frequency of Social Gatherings with Friends and Family: Not on file  . Attends Religious Services: Not on file  . Active Member of Clubs or Organizations: Not on file  . Attends Banker Meetings: Not on file  . Marital Status: Not on file   Family History  Problem Relation Age of Onset  . Hypertension Maternal Grandfather   . Hyperlipidemia Maternal Grandfather   . Melanoma Maternal Grandmother     Review of Systems  Constitutional: Negative.   Respiratory: Negative.   Cardiovascular: Negative.   Gastrointestinal: Negative.   Musculoskeletal: Negative.   Skin: Negative.   Neurological: Negative.   Psychiatric/Behavioral: Positive for agitation and dysphoric mood. Negative for behavioral problems, confusion, decreased concentration, hallucinations, self-injury, sleep disturbance and suicidal ideas. The patient is nervous/anxious. The patient is not hyperactive.     Per HPI  unless specifically indicated above     Objective:    BP 123/79   Pulse 80   Temp 98.5 F (36.9 C) (Oral)   Ht 5\' 1"  (1.549 m)   Wt 141 lb (64 kg)   SpO2 95%   BMI 26.64 kg/m   Wt Readings from Last 3 Encounters:  07/06/20 141 lb (64 kg)  03/25/20 140 lb 9 oz (63.8 kg)  03/17/20 139 lb 3 oz (63.1 kg)    Physical Exam Vitals and nursing note reviewed.  Constitutional:      General: She is not in acute distress.    Appearance: Normal appearance. She is not ill-appearing, toxic-appearing or diaphoretic.  HENT:     Head: Normocephalic and atraumatic.     Right Ear: External ear normal.     Left Ear: External ear normal.     Nose: Nose normal.  Mouth/Throat:     Mouth: Mucous membranes are moist.     Pharynx: Oropharynx is clear.  Eyes:     General: No scleral icterus.       Right eye: No discharge.        Left eye: No discharge.     Extraocular Movements: Extraocular movements intact.     Conjunctiva/sclera: Conjunctivae normal.     Pupils: Pupils are equal, round, and reactive to light.  Cardiovascular:     Rate and Rhythm: Normal rate and regular rhythm.     Pulses: Normal pulses.     Heart sounds: Normal heart sounds. No murmur heard.  No friction rub. No gallop.   Pulmonary:     Effort: Pulmonary effort is normal. No respiratory distress.     Breath sounds: Normal breath sounds. No stridor. No wheezing, rhonchi or rales.  Chest:     Chest wall: No tenderness.  Musculoskeletal:        General: Normal range of motion.     Cervical back: Normal range of motion and neck supple.  Skin:    General: Skin is warm and dry.     Capillary Refill: Capillary refill takes less than 2 seconds.     Coloration: Skin is not jaundiced or pale.     Findings: No bruising, erythema, lesion or rash.  Neurological:     General: No focal deficit present.     Mental Status: She is alert and oriented to person, place, and time. Mental status is at baseline.  Psychiatric:         Mood and Affect: Mood normal.        Behavior: Behavior normal.        Thought Content: Thought content normal.        Judgment: Judgment normal.     Results for orders placed or performed in visit on 07/06/20  VITAMIN D 25 Hydroxy (Vit-D Deficiency, Fractures)  Result Value Ref Range   Vit D, 25-Hydroxy 36.8 30.0 - 100.0 ng/mL  B12  Result Value Ref Range   Vitamin B-12 383 232 - 1,245 pg/mL      Assessment & Plan:   Problem List Items Addressed This Visit      Other   Depression, recurrent (HCC) - Primary    Not doing great. Will start abilify for stabilization. Recheck 2 weeks, may need to add SSRI once a little more stable. Continue to monitor. Call with any concerns.        Other Visit Diagnoses    Chronic fatigue       Labs checked today.    Relevant Orders   VITAMIN D 25 Hydroxy (Vit-D Deficiency, Fractures) (Completed)   B12 (Completed)   Flu vaccine need       Flu shot given today.    Relevant Orders   Flu Vaccine QUAD 36+ mos IM (Completed)       Follow up plan: Return in about 2 weeks (around 07/20/2020).

## 2020-07-07 LAB — VITAMIN B12: Vitamin B-12: 383 pg/mL (ref 232–1245)

## 2020-07-07 LAB — VITAMIN D 25 HYDROXY (VIT D DEFICIENCY, FRACTURES): Vit D, 25-Hydroxy: 36.8 ng/mL (ref 30.0–100.0)

## 2020-07-12 ENCOUNTER — Encounter: Payer: Self-pay | Admitting: Family Medicine

## 2020-07-12 NOTE — Assessment & Plan Note (Signed)
Not doing great. Will start abilify for stabilization. Recheck 2 weeks, may need to add SSRI once a little more stable. Continue to monitor. Call with any concerns.

## 2020-07-20 ENCOUNTER — Encounter: Payer: Self-pay | Admitting: Family Medicine

## 2020-07-20 ENCOUNTER — Telehealth (INDEPENDENT_AMBULATORY_CARE_PROVIDER_SITE_OTHER): Payer: 59 | Admitting: Family Medicine

## 2020-07-20 DIAGNOSIS — F339 Major depressive disorder, recurrent, unspecified: Secondary | ICD-10-CM

## 2020-07-20 MED ORDER — ARIPIPRAZOLE 5 MG PO TABS
5.0000 mg | ORAL_TABLET | Freq: Every day | ORAL | 1 refills | Status: DC
Start: 2020-07-20 — End: 2021-07-14

## 2020-07-20 MED ORDER — BUSPIRONE HCL 5 MG PO TABS: 5.0000 mg | ORAL_TABLET | Freq: Three times a day (TID) | ORAL | 3 refills | Status: DC

## 2020-07-20 NOTE — Progress Notes (Signed)
There were no vitals taken for this visit.   Subjective:    Patient ID: Lori Dixon, female    DOB: 1990-11-11, 29 y.o.   MRN: [id number]  HPI: Lori Dixon is a 29 y.o. female  Chief Complaint  Patient presents with  . Depression   DEPRESSION Mood status: better Satisfied with current treatment?: no Symptom severity: moderate  Duration of current treatment : 2 weeks Side effects: no Medication compliance: excellent compliance Psychotherapy/counseling: no  Depressed mood: yes Anxious mood: yes Anhedonia: no Significant weight loss or gain: no Insomnia: no  Fatigue: yes Feelings of worthlessness or guilt: yes Impaired concentration/indecisiveness: no Suicidal ideations: no Hopelessness: no Crying spells: no Depression screen [facility] 2/9 07/20/2020 07/14/2020 02/06/2020  Decreased Interest 1 2 2   Down, Depressed, Hopeless 0 2 2  PHQ - 2 Score 1 4 4   Altered sleeping 1 3 3   Tired, decreased energy 3 3 2   Change in appetite 0 0 0  Feeling bad or failure about yourself  2 2 3   Trouble concentrating 3 3 2   Moving slowly or fidgety/restless 0 0 2  Suicidal thoughts 0 1 1  PHQ-9 Score 10 16 17   Difficult doing work/chores Somewhat difficult Very difficult Very difficult   GAD 7 : Generalized Anxiety Score 07/20/2020 07/14/2020  Nervous, Anxious, on Edge 1 3  Control/stop worrying 2 2  Worry too much - different things 1 2  Trouble relaxing 3 3  Restless 1 1  Easily annoyed or irritable 1 3  Afraid - awful might happen 0 1  Total GAD 7 Score 9 15  Anxiety Difficulty Somewhat difficult Very difficult    Relevant past medical, surgical, family and social history reviewed and updated as indicated. Interim medical history since our last visit reviewed. Allergies and medications reviewed and updated.  Review of Systems  Constitutional: Negative.   Respiratory: Negative.   Cardiovascular: Negative.   Gastrointestinal: Negative.   Musculoskeletal: Negative.     Psychiatric/Behavioral: Negative.     Per HPI unless specifically indicated above     Objective:    There were no vitals taken for this visit.  Wt Readings from Last 3 Encounters:  07/06/20 141 lb (64 kg)  03/25/20 140 lb 9 oz (63.8 kg)  03/17/20 139 lb 3 oz (63.1 kg)    Physical Exam Vitals and nursing note reviewed.  Constitutional:      General: She is not in acute distress.    Appearance: Normal appearance. She is not ill-appearing, toxic-appearing or diaphoretic.  HENT:     Head: Normocephalic and atraumatic.     Right Ear: External ear normal.     Left Ear: External ear normal.     Nose: Nose normal.     Mouth/Throat:     Mouth: Mucous membranes are moist.     Pharynx: Oropharynx is clear.  Eyes:     General: No scleral icterus.       Right eye: No discharge.        Left eye: No discharge.     Conjunctiva/sclera: Conjunctivae normal.     Pupils: Pupils are equal, round, and reactive to light.  Pulmonary:     Effort: Pulmonary effort is normal. No respiratory distress.     Comments: Speaking in full sentences Musculoskeletal:        General: Normal range of motion.     Cervical back: Normal range of motion.  Skin:    Coloration: Skin is not jaundiced or  pale.     Findings: No bruising, erythema, lesion or rash.  Neurological:     Mental Status: She is alert and oriented to person, place, and time. Mental status is at baseline.  Psychiatric:        Mood and Affect: Mood normal.        Behavior: Behavior normal.        Thought Content: Thought content normal.        Judgment: Judgment normal.     Results for orders placed or performed in visit on 07/06/20  VITAMIN D 25 Hydroxy (Vit-D Deficiency, Fractures)  Result Value Ref Range   Vit D, 25-Hydroxy 36.8 30.0 - 100.0 ng/mL  B12  Result Value Ref Range   Vitamin B-12 383 232 - 1,245 pg/mL      Assessment & Plan:   Problem List Items Addressed This Visit      Other   Depression, recurrent (HCC)     Doing better on the abilify. Will stay on current dose for now and let us know if she wants to go up in a couple of weeks. Buspar for increased anxiety. Call with any concerns. Continue to monitor.       Relevant Medications   busPIRone (BUSPAR) 5 MG tablet       Follow up plan: Return in about 6 months (around 01/17/2021).    . This visit was completed via MyChart due to the restrictions of the COVID-19 pandemic. All issues as above were discussed and addressed. Physical exam was done as above through visual confirmation on MyChart. If it was felt that the patient should be evaluated in the office, they were directed there. The patient verbally consented to this visit. . Location of the patient: home . Location of the provider: work . Those involved with this call:  . Provider: Olevia Perches, DO . CMA: Elton Sin, CMA . Front Desk/Registration: Adela Ports  . Time spent on call: 15 minutes with patient face to face via video conference. More than 50% of this time was spent in counseling and coordination of care. 23 minutes total spent in review of patient's record and preparation of their chart.

## 2020-07-20 NOTE — Assessment & Plan Note (Signed)
Doing better on the abilify. Will stay on current dose for now and let us know if she wants to go up in a couple of weeks. Buspar for increased anxiety. Call with any concerns. Continue to monitor.

## 2020-07-22 ENCOUNTER — Telehealth: Payer: Self-pay

## 2020-07-22 NOTE — Telephone Encounter (Signed)
PA for Aripiprazole initiated and submitted via Cover My Meds. Key: [id number]

## 2020-08-04 NOTE — Telephone Encounter (Signed)
Medication does not need a PA, is a covered benefit for this plan.

## 2020-09-22 DIAGNOSIS — Z23 Encounter for immunization: Secondary | ICD-10-CM | POA: Diagnosis not present

## 2020-12-14 ENCOUNTER — Telehealth: Payer: Self-pay

## 2020-12-14 ENCOUNTER — Encounter: Payer: Self-pay | Admitting: Family Medicine

## 2020-12-14 NOTE — Telephone Encounter (Signed)
Called pt to r/s 3/18 appt no answer vm full

## 2020-12-29 ENCOUNTER — Encounter: Payer: Self-pay | Admitting: Family Medicine

## 2020-12-29 NOTE — Telephone Encounter (Signed)
Called no answer vm full sent mychart letter

## 2021-01-08 ENCOUNTER — Ambulatory Visit: Payer: 59 | Admitting: Family Medicine

## 2021-03-29 ENCOUNTER — Encounter: Payer: 59 | Admitting: Certified Nurse Midwife

## 2021-04-14 ENCOUNTER — Encounter: Payer: Self-pay | Admitting: Certified Nurse Midwife

## 2021-07-14 ENCOUNTER — Telehealth: Payer: Self-pay | Admitting: Family Medicine

## 2021-07-14 ENCOUNTER — Ambulatory Visit: Payer: Medicaid Other | Admitting: Family Medicine

## 2021-07-14 ENCOUNTER — Other Ambulatory Visit: Payer: Self-pay

## 2021-07-14 ENCOUNTER — Encounter: Payer: Self-pay | Admitting: Family Medicine

## 2021-07-14 VITALS — BP 124/75 | HR 63 | Temp 98.1°F | Ht 61.0 in | Wt 144.6 lb

## 2021-07-14 DIAGNOSIS — R8281 Pyuria: Secondary | ICD-10-CM

## 2021-07-14 DIAGNOSIS — Z23 Encounter for immunization: Secondary | ICD-10-CM

## 2021-07-14 DIAGNOSIS — Z0000 Encounter for general adult medical examination without abnormal findings: Secondary | ICD-10-CM

## 2021-07-14 LAB — URINALYSIS, ROUTINE W REFLEX MICROSCOPIC
Bilirubin, UA: NEGATIVE
Glucose, UA: NEGATIVE
Ketones, UA: NEGATIVE
Nitrite, UA: NEGATIVE
RBC, UA: NEGATIVE
Specific Gravity, UA: 1.015 (ref 1.005–1.030)
Urobilinogen, Ur: 1 mg/dL (ref 0.2–1.0)
pH, UA: 6.5 (ref 5.0–7.5)

## 2021-07-14 LAB — MICROSCOPIC EXAMINATION: RBC, Urine: NONE SEEN /hpf (ref 0–2)

## 2021-07-14 MED ORDER — BUSPIRONE HCL 5 MG PO TABS: 5.0000 mg | ORAL_TABLET | Freq: Three times a day (TID) | ORAL | 3 refills | Status: DC

## 2021-07-14 MED ORDER — NORETHINDRONE ACET-ETHINYL EST 1.5-30 MG-MCG PO TABS: 1.0000 | ORAL_TABLET | Freq: Every day | ORAL | 4 refills | Status: DC

## 2021-07-14 MED ORDER — ARIPIPRAZOLE 5 MG PO TABS: 5.0000 mg | ORAL_TABLET | Freq: Every day | ORAL | 1 refills | Status: DC

## 2021-07-14 NOTE — Addendum Note (Signed)
Addended by: Pablo Ledger on: 07/14/2021 02:07 PM   Modules accepted: Orders

## 2021-07-14 NOTE — Telephone Encounter (Signed)
Pharmacy calling stating that the Prescription, Norethindrone, came in as 112 tablets. They state that the packs come in 21 day supply, and are needing to confirm if this should be 3 or 4 packs. Please advise.        [facility] [address], [place] - [address]  [address] [place] [address]  Phone: [phone] Fax: [phone]  Hours: Not open 24 hours

## 2021-07-14 NOTE — Progress Notes (Signed)
BP 124/75   Pulse 63   Temp 98.1 F (36.7 C) (Oral)   Ht 5\' 1"  (1.549 m)   Wt 144 lb 9.6 oz (65.6 kg)   LMP 06/15/2021 (Approximate)   SpO2 98%   BMI 27.32 kg/m    Subjective:    Patient ID: 06/17/2021, female    DOB: 14-Sep-1991, 30 y.o.   MRN: [id number]  HPI: Lori Dixon is a 30 y.o. female presenting on 07/14/2021 for comprehensive medical examination. Current medical complaints include:none  She currently lives with: kids and their father Menopausal Symptoms: no  Depression Screen done today and results listed below:  Depression screen [facility] 2/9 07/14/2021 07/20/2020 07/14/2020 02/06/2020  Decreased Interest 1 1 2 2   Down, Depressed, Hopeless 1 0 2 2  PHQ - 2 Score 2 1 4 4   Altered sleeping 3 1 3 3   Tired, decreased energy 3 3 3 2   Change in appetite 0 0 0 0  Feeling bad or failure about yourself  0 2 2 3   Trouble concentrating 3 3 3 2   Moving slowly or fidgety/restless 0 0 0 2  Suicidal thoughts 0 0 1 1  PHQ-9 Score 11 10 16 17   Difficult doing work/chores Somewhat difficult Somewhat difficult Very difficult Very difficult    Past Medical History:  Past Medical History:  Diagnosis Date   Anxiety    Depression    Heart murmur    AS A CHILD   Leaky heart valve     Surgical History:  Past Surgical History:  Procedure Laterality Date   HYSTEROSCOPY N/A 02/04/2016   Procedure: HYSTEROSCOPY WITH MYOSURE;  Surgeon: , MD;  Location: [facility];  Service: Gynecology;  Laterality: N/A;   LAPAROSCOPY N/A 02/04/2016   Procedure: LAPAROSCOPY DIAGNOSTIC;  Surgeon: , MD;  Location: [facility];  Service: Gynecology;  Laterality: N/A;   NO PAST SURGERIES      Medications:  No current outpatient medications on file prior to visit.   No current facility-administered medications on file prior to visit.    Allergies:  No Known Allergies  Social History:  Social History   Socioeconomic History   Marital status: Married    Spouse  name: Not on file   Number of children: Not on file   Years of education: Not on file   Highest education level: Not on file  Occupational History   Not on file  Tobacco Use   Smoking status: Never   Smokeless tobacco: Never  Vaping Use   Vaping Use: Never used  Substance and Sexual Activity   Alcohol use: Yes    Comment: OCC   Drug use: No   Sexual activity: Not Currently    Birth control/protection: None  Other Topics Concern   Not on file  Social History Narrative   Not on file   Social Determinants of Health   Financial Resource Strain: Not on file  Food Insecurity: Not on file  Transportation Needs: Not on file  Physical Activity: Not on file  Stress: Not on file  Social Connections: Not on file  Intimate Partner Violence: Not on file   Social History   Tobacco Use  Smoking Status Never  Smokeless Tobacco Never   Social History   Substance and Sexual Activity  Alcohol Use Yes   Comment: OCC    Family History:  Family History  Problem Relation Age of Onset   Hypertension Maternal Grandfather    Hyperlipidemia Maternal Grandfather  Melanoma Maternal Grandmother     Past medical history, surgical history, medications, allergies, family history and social history reviewed with patient today and changes made to appropriate areas of the chart.   Review of Systems  Constitutional: Negative.   HENT: Negative.    Eyes: Negative.   Respiratory: Negative.    Cardiovascular: Negative.   Gastrointestinal:  Positive for heartburn. Negative for abdominal pain, blood in stool, constipation, diarrhea, melena, nausea and vomiting.  Genitourinary: Negative.   Musculoskeletal:  Positive for myalgias. Negative for back pain, falls, joint pain and neck pain.  Skin: Negative.   Neurological: Negative.   Endo/Heme/Allergies: Negative.   Psychiatric/Behavioral: Negative.    All other ROS negative except what is listed above and in the HPI.      Objective:    BP  124/75   Pulse 63   Temp 98.1 F (36.7 C) (Oral)   Ht 5\' 1"  (1.549 m)   Wt 144 lb 9.6 oz (65.6 kg)   LMP 06/15/2021 (Approximate)   SpO2 98%   BMI 27.32 kg/m   Wt Readings from Last 3 Encounters:  07/14/21 144 lb 9.6 oz (65.6 kg)  07/06/20 141 lb (64 kg)  03/25/20 140 lb 9 oz (63.8 kg)    Physical Exam Vitals and nursing note reviewed.  Constitutional:      General: She is not in acute distress.    Appearance: Normal appearance. She is not ill-appearing, toxic-appearing or diaphoretic.  HENT:     Head: Normocephalic and atraumatic.     Right Ear: Tympanic membrane, ear canal and external ear normal. There is no impacted cerumen.     Left Ear: Tympanic membrane, ear canal and external ear normal. There is no impacted cerumen.     Nose: Nose normal. No congestion or rhinorrhea.     Mouth/Throat:     Mouth: Mucous membranes are moist.     Pharynx: Oropharynx is clear. No oropharyngeal exudate or posterior oropharyngeal erythema.  Eyes:     General: No scleral icterus.       Right eye: No discharge.        Left eye: No discharge.     Extraocular Movements: Extraocular movements intact.     Conjunctiva/sclera: Conjunctivae normal.     Pupils: Pupils are equal, round, and reactive to light.  Neck:     Vascular: No carotid bruit.  Cardiovascular:     Rate and Rhythm: Normal rate and regular rhythm.     Pulses: Normal pulses.     Heart sounds: No murmur heard.   No friction rub. No gallop.  Pulmonary:     Effort: Pulmonary effort is normal. No respiratory distress.     Breath sounds: Normal breath sounds. No stridor. No wheezing, rhonchi or rales.  Chest:     Chest wall: No tenderness.  Abdominal:     General: Abdomen is flat. Bowel sounds are normal. There is no distension.     Palpations: Abdomen is soft. There is no mass.     Tenderness: There is no abdominal tenderness. There is no right CVA tenderness, left CVA tenderness, guarding or rebound.     Hernia: No hernia is  present.  Genitourinary:    Comments: Breast and pelvic exams deferred with shared decision making Musculoskeletal:        General: No swelling, tenderness, deformity or signs of injury.     Cervical back: Normal range of motion and neck supple. No rigidity. No muscular tenderness.  Right lower leg: No edema.     Left lower leg: No edema.  Lymphadenopathy:     Cervical: No cervical adenopathy.  Skin:    General: Skin is warm and dry.     Capillary Refill: Capillary refill takes less than 2 seconds.     Coloration: Skin is not jaundiced or pale.     Findings: No bruising, erythema, lesion or rash.  Neurological:     General: No focal deficit present.     Mental Status: She is alert and oriented to person, place, and time. Mental status is at baseline.     Cranial Nerves: No cranial nerve deficit.     Sensory: No sensory deficit.     Motor: No weakness.     Coordination: Coordination normal.     Gait: Gait normal.     Deep Tendon Reflexes: Reflexes normal.  Psychiatric:        Mood and Affect: Mood normal.        Behavior: Behavior normal.        Thought Content: Thought content normal.        Judgment: Judgment normal.    Results for orders placed or performed in visit on 07/06/20  VITAMIN D 25 Hydroxy (Vit-D Deficiency, Fractures)  Result Value Ref Range   Vit D, 25-Hydroxy 36.8 30.0 - 100.0 ng/mL  B12  Result Value Ref Range   Vitamin B-12 383 232 - 1,245 pg/mL      Assessment & Plan:   Problem List Items Addressed This Visit   None Visit Diagnoses     Routine general medical examination at a health care facility    -  Primary   Vaccines up to date/declined. Screening labs checked today. Pap up to date. Continue diet and exercise. Call with any concerns. Continue to monitor.    Relevant Orders   CBC with Differential/Platelet   Comprehensive metabolic panel   Lipid Panel w/o Chol/HDL Ratio   Urinalysis, Routine w reflex microscopic   TSH   HIV Antibody  (routine testing w rflx)   RPR   GC/Chlamydia Probe Amp   HSV(herpes simplex vrs) 1+2 ab-IgG   Acute Viral Hepatitis (HAV, HBV, HCV)   Need for influenza vaccination       Relevant Orders   Flu Vaccine QUAD 60mo+IM (Fluarix, Fluzone & Alfiuria Quad PF)        Follow up plan: Return in about 6 months (around 01/11/2022).   LABORATORY TESTING:  - Pap smear: up to date  IMMUNIZATIONS:   - Tdap: Tetanus vaccination status reviewed: last tetanus booster within 10 years. - Influenza: Administered today - Pneumovax: Not applicable - Prevnar: Not applicable - COVID: Refused  PATIENT COUNSELING:   Advised to take 1 mg of folate supplement per day if capable of pregnancy.   Sexuality: Discussed sexually transmitted diseases, partner selection, use of condoms, avoidance of unintended pregnancy  and contraceptive alternatives.   Advised to avoid cigarette smoking.  I discussed with the patient that most people either abstain from alcohol or drink within safe limits (<=14/week and <=4 drinks/occasion for males, <=7/weeks and <= 3 drinks/occasion for females) and that the risk for alcohol disorders and other health effects rises proportionally with the number of drinks per week and how often a drinker exceeds daily limits.  Discussed cessation/primary prevention of drug use and availability of treatment for abuse.   Diet: Encouraged to adjust caloric intake to maintain  or achieve ideal body weight, to reduce intake of  dietary saturated fat and total fat, to limit sodium intake by avoiding high sodium foods and not adding table salt, and to maintain adequate dietary potassium and calcium preferably from fresh fruits, vegetables, and low-fat dairy products.    stressed the importance of regular exercise  Injury prevention: Discussed safety belts, safety helmets, smoke detector, smoking near bedding or upholstery.   Dental health: Discussed importance of regular tooth brushing, flossing, and  dental visits.    NEXT PREVENTATIVE PHYSICAL DUE IN 1 YEAR. Return in about 6 months (around 01/11/2022).

## 2021-07-15 LAB — CBC WITH DIFFERENTIAL/PLATELET
Basophils Absolute: 0.1 10*3/uL (ref 0.0–0.2)
Basos: 1 %
EOS (ABSOLUTE): 0.1 10*3/uL (ref 0.0–0.4)
Eos: 1 %
Hematocrit: 42 % (ref 34.0–46.6)
Hemoglobin: 13.9 g/dL (ref 11.1–15.9)
Immature Grans (Abs): 0 10*3/uL (ref 0.0–0.1)
Immature Granulocytes: 0 %
Lymphocytes Absolute: 3.5 10*3/uL — ABNORMAL HIGH (ref 0.7–3.1)
Lymphs: 35 %
MCH: 30.2 pg (ref 26.6–33.0)
MCHC: 33.1 g/dL (ref 31.5–35.7)
MCV: 91 fL (ref 79–97)
Monocytes Absolute: 0.6 10*3/uL (ref 0.1–0.9)
Monocytes: 6 %
Neutrophils Absolute: 5.6 10*3/uL (ref 1.4–7.0)
Neutrophils: 57 %
Platelets: 253 10*3/uL (ref 150–450)
RBC: 4.61 x10E6/uL (ref 3.77–5.28)
RDW: 12.1 % (ref 11.7–15.4)
WBC: 9.9 10*3/uL (ref 3.4–10.8)

## 2021-07-15 LAB — ACUTE VIRAL HEPATITIS (HAV, HBV, HCV)
HCV Ab: <0.1 s/co ratio (ref 0.0–0.9)
Hep A IgM: NEGATIVE
Hep B C IgM: NEGATIVE
Hepatitis B Surface Ag: NEGATIVE

## 2021-07-15 LAB — LIPID PANEL W/O CHOL/HDL RATIO
Cholesterol, Total: 216 mg/dL — ABNORMAL HIGH (ref 100–199)
HDL: 49 mg/dL (ref >39)
LDL Chol Calc (NIH): 135 mg/dL — ABNORMAL HIGH (ref 0–99)
Triglycerides: 182 mg/dL — ABNORMAL HIGH (ref 0–149)
VLDL Cholesterol Cal: 32 mg/dL (ref 5–40)

## 2021-07-15 LAB — COMPREHENSIVE METABOLIC PANEL
ALT: 14 IU/L (ref 0–32)
AST: 12 IU/L (ref 0–40)
Albumin/Globulin Ratio: 1.5 (ref 1.2–2.2)
Albumin: 4.4 g/dL (ref 3.9–5.0)
Alkaline Phosphatase: 55 IU/L (ref 44–121)
BUN/Creatinine Ratio: 9 (ref 9–23)
BUN: 8 mg/dL (ref 6–20)
Bilirubin Total: <0.2 mg/dL (ref 0.0–1.2)
CO2: 23 mmol/L (ref 20–29)
Calcium: 9 mg/dL (ref 8.7–10.2)
Chloride: 103 mmol/L (ref 96–106)
Creatinine, Ser: 0.92 mg/dL (ref 0.57–1.00)
Globulin, Total: 2.9 g/dL (ref 1.5–4.5)
Glucose: 81 mg/dL (ref 65–99)
Potassium: 4.1 mmol/L (ref 3.5–5.2)
Sodium: 139 mmol/L (ref 134–144)
Total Protein: 7.3 g/dL (ref 6.0–8.5)
eGFR: 86 mL/min/{1.73_m2} (ref >59)

## 2021-07-15 LAB — HIV ANTIBODY (ROUTINE TESTING W REFLEX): HIV Screen 4th Generation wRfx: NONREACTIVE

## 2021-07-15 LAB — TSH: TSH: 2.39 u[IU]/mL (ref 0.450–4.500)

## 2021-07-15 LAB — HSV(HERPES SIMPLEX VRS) I + II AB-IGG
HSV 1 Glycoprotein G Ab, IgG: <0.91 index (ref 0.00–0.90)
HSV 2 IgG, Type Spec: <0.91 index (ref 0.00–0.90)

## 2021-07-15 LAB — RPR: RPR Ser Ql: NONREACTIVE

## 2021-07-15 LAB — HCV INTERPRETATION

## 2021-07-15 NOTE — Telephone Encounter (Signed)
4 packs please

## 2021-07-15 NOTE — Telephone Encounter (Signed)
Pharmacy notified.

## 2021-07-17 LAB — URINE CULTURE

## 2021-07-22 LAB — GC/CHLAMYDIA PROBE AMP
Chlamydia trachomatis, NAA: NEGATIVE
Neisseria Gonorrhoeae by PCR: NEGATIVE

## 2021-10-24 NOTE — L&D Delivery Note (Signed)
Delivery Note   Lori Dixon is a 31 y.o. [id number] at [redacted]w[redacted]d Estimated Date of Delivery: 10/05/22  PRE-OPERATIVE DIAGNOSIS:  1) [redacted]w[redacted]d pregnancy.      SROM  POST-OPERATIVE DIAGNOSIS:  1) [redacted]w[redacted]d pregnancy s/p Vaginal, Spontaneous   Delivery Type: Vaginal, Spontaneous    Delivery Anesthesia: None   Labor Complications: None    ESTIMATED BLOOD LOSS: 50  ml    FINDINGS:   1) female infant, "Blakely," Apgar scores of 9   at 1 minute and 9   at 5 minutes and a birthweight pending.   SPECIMENS:   PLACENTA:   Appearance: Intact    Removal: Spontaneous      Disposition: Per protocol  CORD BLOOD: Not Indicated  DISPOSITION:  Infant left in stable condition in the delivery room, with L&D personnel and mother,  NARRATIVE SUMMARY: Labor course:  Lori Dixon is a [id number] at [redacted]w[redacted]d who presented to Labor & Delivery for SROM and contractions. Her initial cervical exam was 4/80/-1. Labor proceeded spontaneously and she was found to be completely dilated at 0418. With excellent maternal pushing effort, she birthed a viable female infant ROA at 64. There was a nuchal cord noted. The shoulders were birthed without difficulty. The infant was placed skin-to-skin with Shanda Bumps. The cord was doubly clamped and cut by the father when pulsations ceased. The placenta delivered spontaneously and was noted to be intact with a 3VC. A perineal and vaginal examination was performed. Episiotomy/Lacerations: None   Lori Dixon tolerated this well. Mother and baby were left in stable condition.   Guadlupe Spanish, CNM 09/25/2022 4:54 AM

## 2021-12-22 DIAGNOSIS — Z419 Encounter for procedure for purposes other than remedying health state, unspecified: Secondary | ICD-10-CM | POA: Diagnosis not present

## 2021-12-31 ENCOUNTER — Ambulatory Visit: Payer: Medicaid Other | Admitting: Family Medicine

## 2022-01-11 ENCOUNTER — Ambulatory Visit: Payer: Medicaid Other | Admitting: Family Medicine

## 2022-01-14 ENCOUNTER — Ambulatory Visit: Payer: Medicaid Other | Admitting: Family Medicine

## 2022-01-22 DIAGNOSIS — Z419 Encounter for procedure for purposes other than remedying health state, unspecified: Secondary | ICD-10-CM | POA: Diagnosis not present

## 2022-01-25 ENCOUNTER — Encounter: Payer: Medicaid Other | Admitting: Certified Nurse Midwife

## 2022-01-26 ENCOUNTER — Ambulatory Visit (INDEPENDENT_AMBULATORY_CARE_PROVIDER_SITE_OTHER): Payer: BC Managed Care – PPO | Admitting: Certified Nurse Midwife

## 2022-01-26 ENCOUNTER — Encounter: Payer: Self-pay | Admitting: Certified Nurse Midwife

## 2022-01-26 ENCOUNTER — Ambulatory Visit (INDEPENDENT_AMBULATORY_CARE_PROVIDER_SITE_OTHER): Payer: BC Managed Care – PPO

## 2022-01-26 ENCOUNTER — Other Ambulatory Visit: Payer: Self-pay | Admitting: Obstetrics

## 2022-01-26 VITALS — BP 115/75 | HR 78 | Ht 61.0 in | Wt 141.3 lb

## 2022-01-26 DIAGNOSIS — Z3200 Encounter for pregnancy test, result unknown: Secondary | ICD-10-CM

## 2022-01-26 LAB — POCT URINE PREGNANCY: Preg Test, Ur: POSITIVE — AB

## 2022-01-26 NOTE — Progress Notes (Signed)
Subjective:  ? ? Lori Dixon is a 31 y.o. female who presents for evaluation of amenorrhea. She believes she could be pregnant. Pregnancy is desired. Sexual Activity: single partner, contraception: none. Current symptoms also include: positive home pregnancy test. Last period was unknown. ?  ?No LMP recorded (lmp unknown). Patient is pregnant. ?The following portions of the patient's history were reviewed and updated as appropriate: allergies, current medications, past family history, past medical history, past social history, past surgical history, and problem list. ? ?Review of Systems ?Pertinent items are noted in HPI.   ?  ?Objective:  ? ? BP 115/75   Pulse 78   Ht 5\' 1"  (1.549 m)   Wt 141 lb 4.8 oz (64.1 kg)   LMP  (LMP Unknown)   BMI 26.70 kg/m?  ?General: alert, cooperative, appears stated age, and no acute distress   ? ?Lab Review ?Urine HCG: positive  ?  ?Assessment:  ? ? Absence of menstruation.   ?  ?Plan:  ? ? Pregnancy Test:  Positive: EDC: unknown. Briefly discussed pre-natal care options. Encouraged well-balanced diet, plenty of rest when needed, pre-natal vitamins daily and walking for exercise. Discussed self-help for nausea, avoiding OTC medications until consulting provider or pharmacist, other than Tylenol as needed, minimal caffeine (1-2 cups daily) and avoiding alcohol. She will schedule u/s for dating as soon as possible, nurse visit at 10 wks and her initial NOB visit @ [redacted] wks pregnant. Feel free to call with any questions.  ? ?Philip Aspen, CNM  ?

## 2022-01-26 NOTE — Patient Instructions (Signed)
Prenatal Care ?Prenatal care is health care during pregnancy. It helps you and your unborn baby (fetus) stay as healthy as possible. Prenatal care may be provided by a midwife, a family practice doctor, a mid-level practitioner (nurse practitioner or physician assistant), or a childbirth and pregnancy doctor (obstetrician). ?How does this affect me? ?During pregnancy, you will be closely monitored for any new conditions that might develop. To lower your risk of pregnancy complications, you and your health care provider will talk about any underlying conditions you have. ?How does this affect my baby? ?Early and consistent prenatal care increases the chance that your baby will be healthy during pregnancy. Prenatal care lowers the risk that your baby will be: ?Born early (prematurely). ?Smaller than expected at birth (small for gestational age). ?What can I expect at the first prenatal care visit? ?Your first prenatal care visit will likely be the longest. You should schedule your first prenatal care visit as soon as you know that you are pregnant. Your first visit is a good time to talk about any questions or concerns you have about pregnancy. ?Medical history ?At your visit, you and your health care provider will talk about your medical history, including: ?Any past pregnancies. ?Your family's medical history. ?Medical history of the baby's father. ?Any long-term (chronic) health conditions you have and how you manage them. ?Any surgeries or procedures you have had. ?Any current over-the-counter or prescription medicines, herbs, or supplements that you are taking. ?Other factors that could pose a risk to your baby, including: ?Exposure to harmful chemicals or radiation at work or at home. ?Any substance use, including tobacco, alcohol, and drug use. ?Your home setting and your stress levels, including: ?Exposure to abuse or violence. ?Household financial strain. ?Your daily health habits, including diet and  exercise. ?Tests and screenings ?Your health care provider will: ?Measure your weight, height, and blood pressure. ?Do a physical exam, including a pelvic and breast exam. ?Perform blood tests and urine tests to check for: ?Urinary tract infection. ?Sexually transmitted infections (STIs). ?Low iron levels in your blood (anemia). ?Blood type and certain proteins on red blood cells (Rh antibodies). ?Infections and immunity to viruses, such as hepatitis B and rubella. ?HIV (human immunodeficiency virus). ?Discuss your options for genetic screening. ?Tips about staying healthy ?Your health care provider will also give you information about how to keep yourself and your baby healthy, including: ?Nutrition and taking vitamins. ?Physical activity. ?How to manage pregnancy symptoms such as nausea and vomiting (morning sickness). ?Infections and substances that may be harmful to your baby and how to avoid them. ?Food safety. ?Dental care. ?Working. ?Travel. ?Warning signs to watch for and when to call your health care provider. ?How often will I have prenatal care visits? ?After your first prenatal care visit, you will have regular visits throughout your pregnancy. The visit schedule is often as follows: ?Up to week 28 of pregnancy: once every 4 weeks. ?28-36 weeks: once every 2 weeks. ?After 36 weeks: every week until delivery. ?Some women may have visits more or less often depending on any underlying health conditions and the health of the baby. ?Keep all follow-up and prenatal care visits. This is important. ?What happens during routine prenatal care visits? ?Your health care provider will: ?Measure your weight and blood pressure. ?Check for fetal heart sounds. ?Measure the height of your uterus in your abdomen (fundal height). This may be measured starting around week 20 of pregnancy. ?Check the position of your baby inside your uterus. ?Ask questions   about your diet, sleeping patterns, and whether you can feel the baby  move. ?Review warning signs to watch for and signs of labor. ?Ask about any pregnancy symptoms you are having and how you are dealing with them. Symptoms may include: ?Headaches. ?Nausea and vomiting. ?Vaginal discharge. ?Swelling. ?Fatigue. ?Constipation. ?Changes in your vision. ?Feeling persistently sad or anxious. ?Any discomfort, including back or pelvic pain. ?Bleeding or spotting. ?Make a list of questions to ask your health care provider at your routine visits. ?What tests might I have during prenatal care visits? ?You may have blood, urine, and imaging tests throughout your pregnancy, such as: ?Urine tests to check for glucose, protein, or signs of infection. ?Glucose tests to check for a form of diabetes that can develop during pregnancy (gestational diabetes mellitus). This is usually done around week 24 of pregnancy. ?Ultrasounds to check your baby's growth and development, to check for birth defects, and to check your baby's well-being. These can also help to decide when you should deliver your baby. ?A test to check for group B strep (GBS) infection. This is usually done around week 36 of pregnancy. ?Genetic testing. This may include blood, fluid, or tissue sampling, or imaging tests, such as an ultrasound. Some genetic tests are done during the first trimester and some are done during the second trimester. ?What else can I expect during prenatal care visits? ?Your health care provider may recommend getting certain vaccines during pregnancy. These may include: ?A yearly flu shot (annual influenza vaccine). This is especially important if you will be pregnant during flu season. ?Tdap (tetanus, diphtheria, pertussis) vaccine. Getting this vaccine during pregnancy can protect your baby from whooping cough (pertussis) after birth. This vaccine may be recommended between weeks 27 and 36 of pregnancy. ?A COVID-19 vaccine. ?Later in your pregnancy, your health care provider may give you information  about: ?Childbirth and breastfeeding classes. ?Choosing a health care provider for your baby. ?Umbilical cord banking. ?Breastfeeding. ?Birth control after your baby is born. ?The hospital labor and delivery unit and how to set up a tour. ?Registering at the hospital before you go into labor. ?Where to find more information ?Office on Women's Health: womenshealth.gov ?American Pregnancy Association: americanpregnancy.org ?March of Dimes: marchofdimes.org ?Summary ?Prenatal care helps you and your baby stay as healthy as possible during pregnancy. ?Your first prenatal care visit will most likely be the longest. ?You will have visits and tests throughout your pregnancy to monitor your health and your baby's health. ?Bring a list of questions to your visits to ask your health care provider. ?Make sure to keep all follow-up and prenatal care visits. ?This information is not intended to replace advice given to you by your health care provider. Make sure you discuss any questions you have with your health care provider. ?Document Revised: 07/23/2020 Document Reviewed: 07/23/2020 ?Elsevier Patient Education ? 2022 Elsevier Inc. ? ?

## 2022-01-27 ENCOUNTER — Encounter: Payer: Self-pay | Admitting: Certified Nurse Midwife

## 2022-01-27 LAB — HUMAN CHORIONIC GONADOTROPIN(HCG),B-SUBUNIT,QUANTITATIVE): HCG, Beta Chain, Quant, S: 193 m[IU]/mL

## 2022-01-28 ENCOUNTER — Ambulatory Visit: Payer: Medicaid Other | Admitting: Family Medicine

## 2022-01-31 ENCOUNTER — Other Ambulatory Visit: Payer: Self-pay | Admitting: Certified Nurse Midwife

## 2022-01-31 DIAGNOSIS — Z3201 Encounter for pregnancy test, result positive: Secondary | ICD-10-CM

## 2022-02-04 ENCOUNTER — Other Ambulatory Visit: Payer: BC Managed Care – PPO

## 2022-02-04 DIAGNOSIS — Z3201 Encounter for pregnancy test, result positive: Secondary | ICD-10-CM

## 2022-02-05 LAB — BETA HCG QUANT (REF LAB): hCG Quant: 4493 m[IU]/mL

## 2022-02-08 ENCOUNTER — Encounter: Payer: Self-pay | Admitting: Certified Nurse Midwife

## 2022-02-10 ENCOUNTER — Other Ambulatory Visit: Payer: Self-pay

## 2022-02-10 DIAGNOSIS — Z3687 Encounter for antenatal screening for uncertain dates: Secondary | ICD-10-CM

## 2022-02-10 NOTE — Telephone Encounter (Signed)
Outpt u/s has been placed, r.c. working on scheduling.  ?

## 2022-02-11 ENCOUNTER — Ambulatory Visit
Admission: RE | Admit: 2022-02-11 | Discharge: 2022-02-11 | Disposition: A | Discharge status: Home / Self Care | Payer: BC Managed Care – PPO | Source: Ambulatory Visit | Attending: Certified Nurse Midwife | Admitting: Certified Nurse Midwife

## 2022-02-11 DIAGNOSIS — Z3491 Encounter for supervision of normal pregnancy, unspecified, first trimester: Secondary | ICD-10-CM | POA: Diagnosis not present

## 2022-02-11 DIAGNOSIS — Z3A01 Less than 8 weeks gestation of pregnancy: Secondary | ICD-10-CM | POA: Diagnosis not present

## 2022-02-11 DIAGNOSIS — Z3687 Encounter for antenatal screening for uncertain dates: Secondary | ICD-10-CM | POA: Insufficient documentation

## 2022-02-14 ENCOUNTER — Telehealth: Payer: Self-pay | Admitting: Certified Nurse Midwife

## 2022-02-14 NOTE — Telephone Encounter (Signed)
Pt called she had an [place] completed Friday at the [facility] in [place], she was concerned and was requesting the results of [place]. Please advise. ?

## 2022-02-15 ENCOUNTER — Other Ambulatory Visit: Payer: Self-pay

## 2022-02-18 ENCOUNTER — Other Ambulatory Visit: Payer: BC Managed Care – PPO

## 2022-02-21 DIAGNOSIS — Z419 Encounter for procedure for purposes other than remedying health state, unspecified: Secondary | ICD-10-CM | POA: Diagnosis not present

## 2022-03-11 ENCOUNTER — Telehealth: Payer: Self-pay | Admitting: Certified Nurse Midwife

## 2022-03-11 ENCOUNTER — Ambulatory Visit (INDEPENDENT_AMBULATORY_CARE_PROVIDER_SITE_OTHER): Payer: BC Managed Care – PPO | Admitting: Obstetrics

## 2022-03-11 VITALS — BP 105/67 | HR 98 | Ht 61.0 in | Wt 140.6 lb

## 2022-03-11 DIAGNOSIS — Z0283 Encounter for blood-alcohol and blood-drug test: Secondary | ICD-10-CM | POA: Diagnosis not present

## 2022-03-11 DIAGNOSIS — Z3481 Encounter for supervision of other normal pregnancy, first trimester: Secondary | ICD-10-CM | POA: Diagnosis not present

## 2022-03-11 DIAGNOSIS — Z3A10 10 weeks gestation of pregnancy: Secondary | ICD-10-CM | POA: Diagnosis not present

## 2022-03-11 DIAGNOSIS — Z1379 Encounter for other screening for genetic and chromosomal anomalies: Secondary | ICD-10-CM

## 2022-03-11 DIAGNOSIS — Z113 Encounter for screening for infections with a predominantly sexual mode of transmission: Secondary | ICD-10-CM | POA: Diagnosis not present

## 2022-03-11 NOTE — Telephone Encounter (Signed)
Pt is requesting you reach out to her. Pt states she has sent multiple messages via My Chart with no response specifically from provider. Pt states she is very upset and feels she is not being heard by "Miss Pattricia Boss". She may change her care providers. Pt is very concerned with this pregnancy and feels since she did not get good news at the [place] . Pt would like to connect with you as soon as possible.

## 2022-03-11 NOTE — Progress Notes (Unsigned)
Dellia Cloud presents for NOB nurse interview visit. Pregnancy confirmation done 01/26/22.  [id number]. Pregnancy education material explained and given. NOB labs ordered.  Body mass index is 26.57 kg/m. HIV labs and Drug screen were explained optional and she did not decline. Drug screen ordered. PNV encouraged and taking. Pharmacy up to date. Genetic screening options discussed. Genetic testing: to be done at [facility], explained to pt that due to early gestational age it is advised to wait til end of first trimester for more accurate genetic testing/screening results. Pt given option to test today but understood the disadvantage of testing potentially too early and agreed to have testing done at next appt. Financial policy reviewed. FMLA from reviewed and signed. Pt. To follow up with provider in 2 weeks for NOB physical.   Pt visually upset at checkout due to lab/insurance co pay and that she has not seen a provider in office and does not understand why she can not see provider in office. Staff explained to pt. that due to provider schedules and nature of today's visit that she would be seeing nurse only. Pt requested that genetic screening be done at this time and verbally understood/agreed to potential of insufficient results. All questions answered.

## 2022-03-12 LAB — URINALYSIS, ROUTINE W REFLEX MICROSCOPIC
Bilirubin, UA: NEGATIVE
Glucose, UA: NEGATIVE
Ketones, UA: NEGATIVE
Leukocytes,UA: NEGATIVE
Nitrite, UA: NEGATIVE
RBC, UA: NEGATIVE
Specific Gravity, UA: 1.022 (ref 1.005–1.030)
Urobilinogen, Ur: 1 mg/dL (ref 0.2–1.0)
pH, UA: 7.5 (ref 5.0–7.5)

## 2022-03-13 LAB — ANTIBODY SCREEN: Antibody Screen: NEGATIVE

## 2022-03-13 LAB — RUBELLA SCREEN: Rubella Antibodies, IGG: 7.78 index (ref >0.99)

## 2022-03-13 LAB — URINE CULTURE, OB REFLEX

## 2022-03-13 LAB — VARICELLA ZOSTER ANTIBODY, IGG: Varicella zoster IgG: 924 index (ref >165)

## 2022-03-13 LAB — TOXOPLASMA ANTIBODIES- IGG AND  IGM
Toxoplasma Antibody- IgM: <3 AU/mL (ref 0.0–7.9)
Toxoplasma IgG Ratio: <3 IU/mL (ref 0.0–7.1)

## 2022-03-13 LAB — VIRAL HEPATITIS HBV, HCV
HCV Ab: NONREACTIVE
Hep B Core Total Ab: NEGATIVE
Hep B Surface Ab, Qual: REACTIVE
Hepatitis B Surface Ag: NEGATIVE

## 2022-03-13 LAB — ABO AND RH: Rh Factor: POSITIVE

## 2022-03-13 LAB — HIV ANTIBODY (ROUTINE TESTING W REFLEX): HIV Screen 4th Generation wRfx: NONREACTIVE

## 2022-03-13 LAB — CULTURE, OB URINE

## 2022-03-13 LAB — HCV INTERPRETATION

## 2022-03-13 LAB — RPR: RPR Ser Ql: NONREACTIVE

## 2022-03-13 LAB — HGB SOLU + RFLX FRAC: Sickle Solubility Test - HGBRFX: NEGATIVE

## 2022-03-15 ENCOUNTER — Encounter: Payer: Self-pay | Admitting: Certified Nurse Midwife

## 2022-03-15 LAB — GC/CHLAMYDIA PROBE AMP
Chlamydia trachomatis, NAA: NEGATIVE
Neisseria Gonorrhoeae by PCR: NEGATIVE

## 2022-03-15 NOTE — Telephone Encounter (Signed)
Attempt to call pt, per pt request. No answer . Message left.   Doreene Burke, CNM

## 2022-03-17 LAB — MATERNIT21  PLUS CORE+ESS+SCA, BLOOD

## 2022-03-18 LAB — MONITOR DRUG PROFILE 14(MW)
Amphetamine Scrn, Ur: NEGATIVE ng/mL
BARBITURATE SCREEN URINE: NEGATIVE ng/mL
BENZODIAZEPINE SCREEN, URINE: NEGATIVE ng/mL
Buprenorphine, Urine: NEGATIVE ng/mL
Cocaine (Metab) Scrn, Ur: NEGATIVE ng/mL
Creatinine(Crt), U: 144.8 mg/dL (ref 20.0–300.0)
Fentanyl, Urine: NEGATIVE pg/mL
Meperidine Screen, Urine: NEGATIVE ng/mL
Methadone Screen, Urine: NEGATIVE ng/mL
OXYCODONE+OXYMORPHONE UR QL SCN: NEGATIVE ng/mL
Opiate Scrn, Ur: NEGATIVE ng/mL
Ph of Urine: 7.3 (ref 4.5–8.9)
Phencyclidine Qn, Ur: NEGATIVE ng/mL
Propoxyphene Scrn, Ur: NEGATIVE ng/mL
SPECIFIC GRAVITY: 1.02
Tramadol Screen, Urine: NEGATIVE ng/mL

## 2022-03-18 LAB — NICOTINE SCREEN, URINE: Cotinine Ql Scrn, Ur: NEGATIVE ng/mL

## 2022-03-18 LAB — CANNABINOID (GC/MS), URINE: Cannabinoid: NEGATIVE

## 2022-03-22 ENCOUNTER — Encounter: Payer: Self-pay | Admitting: Obstetrics

## 2022-03-24 ENCOUNTER — Ambulatory Visit (INDEPENDENT_AMBULATORY_CARE_PROVIDER_SITE_OTHER): Payer: BC Managed Care – PPO | Admitting: Obstetrics

## 2022-03-24 ENCOUNTER — Encounter: Payer: Self-pay | Admitting: Obstetrics

## 2022-03-24 VITALS — BP 118/73 | HR 73 | Wt 141.0 lb

## 2022-03-24 DIAGNOSIS — Z3481 Encounter for supervision of other normal pregnancy, first trimester: Secondary | ICD-10-CM

## 2022-03-24 DIAGNOSIS — Z419 Encounter for procedure for purposes other than remedying health state, unspecified: Secondary | ICD-10-CM | POA: Diagnosis not present

## 2022-03-24 NOTE — Progress Notes (Signed)
NEW OB HISTORY AND PHYSICAL  SUBJECTIVE:       Lori Dixon is a 31 y.o. [phone] female,  Estimated Date of Delivery: 10/05/22, [redacted]w[redacted]d based on [place] presents today for establishment of Prenatal Care. She reports nausea. She does have a history of anxiety and was on Abilify and Buspar, which she stopped after finding out she was pregnant.  Social history Partner/Relationship: Partner involved Living situation: Moving to [place] with partner. Has 3 children. Substance use: Denies EtOH, tobacco, vape, and recreational drugs   Gynecologic History No LMP recorded (lmp unknown). Patient is pregnant.  Contraception: none Last Pap: 03/2020. Results were: normal  Obstetric History OB History  Gravida Para Term Preterm AB Living  4 3 3     3   SAB IAB Ectopic Multiple Live Births          3    # Outcome Date GA Lbr Len/2nd Weight Sex Delivery Anes PTL Lv  4 Current           3 Term 05/26/19   7 lb 8 oz (3.402 kg) F Vag-Spont  N LIV  2 Term 05/13/17   8 lb 2 oz (3.685 kg) F Vag-Spont  N LIV  1 Term 10/05/12   7 lb 3 oz (3.26 kg) F Vag-Spont  N LIV    Past Medical History:  Diagnosis Date   Anxiety    Depression    Heart murmur    AS A CHILD   Leaky heart valve     Past Surgical History:  Procedure Laterality Date   HYSTEROSCOPY N/A 02/04/2016   Procedure: HYSTEROSCOPY WITH MYOSURE;  Surgeon: 02/06/2016, MD;  Location: [facility];  Service: Gynecology;  Laterality: N/A;   LAPAROSCOPY N/A 02/04/2016   Procedure: LAPAROSCOPY DIAGNOSTIC;  Surgeon: 02/06/2016, MD;  Location: [facility] OR[facility];  Service: Gynecology;  Laterality: N/A;   NO PAST SURGERIES      Current Outpatient Medications on File Prior to Visit  Medication Sig Dispense Refill   prenatal vitamin w/FE, FA (PRENATAL 1 + 1) 27-1 MG TABS tablet Take 1 tablet by mouth daily at 12 noon.     No current facility-administered medications on file prior to visit.    No Known Allergies  Social History   Socioeconomic  History   Marital status: Divorced    Spouse name: Not on file   Number of children: Not on file   Years of education: Not on file   Highest education level: Not on file  Occupational History   Not on file  Tobacco Use   Smoking status: Never   Smokeless tobacco: Never  Vaping Use   Vaping Use: Never used  Substance and Sexual Activity   Alcohol use: Not Currently    Comment: OCC   Drug use: No   Sexual activity: Not Currently    Birth control/protection: None  Other Topics Concern   Not on file  Social History Narrative   Not on file   Social Determinants of Health   Financial Resource Strain: Not on file  Food Insecurity: Not on file  Transportation Needs: Not on file  Physical Activity: Not on file  Stress: Not on file  Social Connections: Not on file  Intimate Partner Violence: Not on file    Family History  Problem Relation Age of Onset   Hypertension Maternal Grandfather    Hyperlipidemia Maternal Grandfather    Melanoma Maternal Grandmother     The following portions of the patient's history  were reviewed and updated as appropriate: allergies, current medications, past OB history, past medical history, past surgical history, past family history, past social history, and problem list.  History obtained from the patient General ROS: negative for - chills, fatigue, or hot flashes Psychological ROS: positive for - anxiety negative for - depression Breast ROS: negative for breast lumps Respiratory ROS: no cough, shortness of breath, or wheezing Cardiovascular ROS: no chest pain or dyspnea on exertion Gastrointestinal ROS: no abdominal pain, change in bowel habits, or black or bloody stools Genito-Urinary ROS: no dysuria, trouble voiding, or hematuria Musculoskeletal ROS: negative Dermatological ROS: negative   OBJECTIVE: Initial Physical Exam (New OB)  GENERAL APPEARANCE: alert, well appearing HEAD: normocephalic, atraumatic MOUTH: mucous membranes  moist, pharynx normal without lesions THYROID: no thyromegaly or masses present BREASTS: patient declined exam LUNGS: clear to auscultation, no wheezes, rales or rhonchi, symmetric air entry HEART: regular rate and rhythm, no murmurs ABDOMEN: soft, nontender, nondistended, no abnormal masses, no epigastric pain, FHT present (158), fundus soft and palpable above SP EXTREMITIES: no redness or tenderness in the calves or thighs SKIN: normal coloration and turgor, no rashes LYMPH NODES: no adenopathy palpable NEUROLOGIC: alert, oriented, normal speech, no focal findings or movement disorder noted  PELVIC EXAM deferred  ASSESSMENT: Normal pregnancy [redacted]w[redacted]d    PLAN: Routine prenatal care. We discussed an overview of prenatal care and when to call. Reviewed diet, exercise, and weight gain recommendations in pregnancy. Zofran ordered for nausea. Discussed benefits of breastfeeding and lactation resources at [facility]. I reviewed labs and answered all questions.  See orders  Guadlupe Spanish, CNM

## 2022-03-25 ENCOUNTER — Other Ambulatory Visit: Payer: Self-pay | Admitting: Obstetrics

## 2022-03-25 MED ORDER — ONDANSETRON HCL 4 MG PO TABS: 4.0000 mg | ORAL_TABLET | Freq: Three times a day (TID) | ORAL | 2 refills | Status: DC | PRN

## 2022-03-25 MED ORDER — BUSPIRONE HCL 5 MG PO TABS: 5.0000 mg | ORAL_TABLET | Freq: Three times a day (TID) | ORAL | 1 refills | Status: DC

## 2022-04-01 ENCOUNTER — Encounter: Payer: BC Managed Care – PPO | Admitting: Obstetrics

## 2022-04-21 ENCOUNTER — Ambulatory Visit (INDEPENDENT_AMBULATORY_CARE_PROVIDER_SITE_OTHER): Payer: BC Managed Care – PPO | Admitting: Certified Nurse Midwife

## 2022-04-21 VITALS — BP 116/71 | HR 83 | Wt 141.0 lb

## 2022-04-21 DIAGNOSIS — Z3482 Encounter for supervision of other normal pregnancy, second trimester: Secondary | ICD-10-CM

## 2022-04-21 DIAGNOSIS — Z3A16 16 weeks gestation of pregnancy: Secondary | ICD-10-CM

## 2022-04-21 LAB — POCT URINALYSIS DIPSTICK OB
Bilirubin, UA: NEGATIVE
Blood, UA: NEGATIVE
Glucose, UA: NEGATIVE
Ketones, UA: NEGATIVE
Leukocytes, UA: NEGATIVE
Nitrite, UA: NEGATIVE
POC,PROTEIN,UA: NEGATIVE
Spec Grav, UA: 1.01 (ref 1.010–1.025)
Urobilinogen, UA: 0.2 E.U./dL
pH, UA: 7 (ref 5.0–8.0)

## 2022-04-21 NOTE — Patient Instructions (Signed)
Round Ligament Pain  The round ligaments are a pair of cord-like tissues that help support the uterus. They can become a source of pain during pregnancy as the ligaments soften and stretch as the baby grows. The pain usually begins in the second trimester (13-28 weeks) of pregnancy, and should only last for a few seconds when it occurs. However, the pain can come and go until the baby is delivered. The pain does not cause harm to the baby. Round ligament pain is usually a short, sharp, and pinching pain, but it can also be a dull, lingering, and aching pain. The pain is felt in the lower side of the abdomen or in the groin. It usually starts deep in the groin and moves up to the outside of the hip area. The pain may happen when you: Suddenly change position, such as quickly going from a sitting to standing position. Do physical activity. Cough or sneeze. Follow these instructions at home: Managing pain  When the pain starts, relax. Then, try any of these methods to help with the pain: Sit down. Flex your knees up to your abdomen. Lie on your side with one pillow under your abdomen and another pillow between your legs. Sit in a warm bath for 15-20 minutes or until the pain goes away. General instructions Watch your condition for any changes. Move slowly when you sit down or stand up. Stop or reduce your physical activities if they cause pain. Avoid long walks if they cause pain. Take over-the-counter and prescription medicines only as told by your health care provider. Keep all follow-up visits. This is important. Contact a health care provider if: Your pain does not go away with treatment. You feel pain in your back that you did not have before. Your medicine is not helping. You have a fever or chills. You have nausea or vomiting. You have diarrhea. You have pain when you urinate. Get help right away if: You have pain that is a rhythmic, cramping pain similar to labor pains. Labor  pains are usually 2 minutes apart, last for about 1 minute, and involve a bearing down feeling or pressure in your pelvis. You have vaginal bleeding. These symptoms may represent a serious problem that is an emergency. Do not wait to see if the symptoms will go away. Get medical help right away. Call your local emergency services (911 in the [place] Do not drive yourself to the hospital. Summary Round ligament pain is felt in the lower abdomen or groin. This pain usually begins in the second trimester (13-28 weeks) and should only last for a few seconds when it occurs. You may notice the pain when you suddenly change position, when you cough or sneeze, or during physical activity. Relaxing, flexing your knees to your abdomen, lying on one side, or taking a warm bath may help to get rid of the pain. Contact your health care provider if the pain does not go away. This information is not intended to replace advice given to you by your health care provider. Make sure you discuss any questions you have with your health care provider. Document Revised: 12/23/2020 Document Reviewed: 12/23/2020 Elsevier Patient Education  2023 Elsevier Inc.  

## 2022-04-21 NOTE — Progress Notes (Signed)
ROB doing well, no complaints. Having some flutters. Discussed anatomy u/s next visit.Having some sciatica pain. Reviewed self help measures. Discussed order for PT if affecting mobility . Follow up 4 wks  Doreene Burke, CNM

## 2022-04-23 DIAGNOSIS — Z419 Encounter for procedure for purposes other than remedying health state, unspecified: Secondary | ICD-10-CM | POA: Diagnosis not present

## 2022-04-27 ENCOUNTER — Telehealth: Payer: Self-pay | Admitting: Obstetrics

## 2022-04-27 NOTE — Telephone Encounter (Signed)
Called- left message making pt aware of [place] apt- also sent mychart message with [place] apt info

## 2022-05-10 NOTE — Telephone Encounter (Signed)
Made in error

## 2022-05-18 ENCOUNTER — Ambulatory Visit (INDEPENDENT_AMBULATORY_CARE_PROVIDER_SITE_OTHER): Payer: BC Managed Care – PPO | Admitting: Obstetrics

## 2022-05-18 ENCOUNTER — Other Ambulatory Visit: Payer: BC Managed Care – PPO

## 2022-05-18 ENCOUNTER — Ambulatory Visit
Admission: RE | Admit: 2022-05-18 | Discharge: 2022-05-18 | Disposition: A | Discharge status: Home / Self Care | Payer: BC Managed Care – PPO | Source: Ambulatory Visit | Attending: Certified Nurse Midwife | Admitting: Certified Nurse Midwife

## 2022-05-18 VITALS — BP 117/69 | HR 87 | Wt 143.0 lb

## 2022-05-18 DIAGNOSIS — Z3A20 20 weeks gestation of pregnancy: Secondary | ICD-10-CM | POA: Insufficient documentation

## 2022-05-18 DIAGNOSIS — O321XX0 Maternal care for breech presentation, not applicable or unspecified: Secondary | ICD-10-CM | POA: Insufficient documentation

## 2022-05-18 DIAGNOSIS — Z3482 Encounter for supervision of other normal pregnancy, second trimester: Secondary | ICD-10-CM

## 2022-05-18 DIAGNOSIS — Z3689 Encounter for other specified antenatal screening: Secondary | ICD-10-CM | POA: Insufficient documentation

## 2022-05-18 LAB — POCT URINALYSIS DIPSTICK OB
Bilirubin, UA: NEGATIVE
Blood, UA: NEGATIVE
Glucose, UA: NEGATIVE
Ketones, UA: NEGATIVE
Nitrite, UA: NEGATIVE
Spec Grav, UA: 1.015 (ref 1.010–1.025)
Urobilinogen, UA: 0.2 E.U./dL
pH, UA: 7.5 (ref 5.0–8.0)

## 2022-05-18 NOTE — Progress Notes (Signed)
ROB at [redacted]w[redacted]d. Feeling some flutters but unsure if it is fetal movement. Anatomy US this afternoon. Lori Dixon recently moved to the area, is currently not working, is having stress related to that. She has been feeling overwhelmed at times, but feels like it is situational and manageable. She is trying to remember to take her buspirone regularly. Discussed coping mechanisms, encouraged to reach out if symptoms are no longer manageable. RTC in 4 weeks.  Lori Dixon Spanish, CNM

## 2022-05-18 NOTE — Progress Notes (Signed)
ROB- no concerns 

## 2022-05-24 ENCOUNTER — Other Ambulatory Visit: Payer: BC Managed Care – PPO

## 2022-05-24 DIAGNOSIS — Z419 Encounter for procedure for purposes other than remedying health state, unspecified: Secondary | ICD-10-CM | POA: Diagnosis not present

## 2022-06-20 ENCOUNTER — Ambulatory Visit (INDEPENDENT_AMBULATORY_CARE_PROVIDER_SITE_OTHER): Payer: BC Managed Care – PPO | Admitting: Certified Nurse Midwife

## 2022-06-20 VITALS — BP 120/75 | HR 86 | Wt 147.3 lb

## 2022-06-20 DIAGNOSIS — Z3A24 24 weeks gestation of pregnancy: Secondary | ICD-10-CM

## 2022-06-20 NOTE — Progress Notes (Signed)
ROB doing well, feeling good movement. No questions or concerns today. Discussed glucose testing next visit. She verbalizes and agrees to plan . Handout given on eating prior to testing. Follow up 4 wks with Missy.   Doreene Burke, CNM

## 2022-06-20 NOTE — Patient Instructions (Signed)
Oral Glucose Tolerance Test During Pregnancy Why am I having this test? The oral glucose tolerance test (OGTT) is done to check how your body processes blood sugar (glucose). This is one of several tests used to diagnose diabetes that develops during pregnancy (gestational diabetes mellitus). Gestational diabetes is a short-term form of diabetes that some women develop while they are pregnant. It usually occurs during the second trimester of pregnancy and goes away after delivery. Testing, or screening, for gestational diabetes usually occurs at weeks 24-28 of pregnancy. You may have the OGTT test after having a 1-hour glucose screening test if the results from that test indicate that you may have gestational diabetes. This test may also be needed if: You have a history of gestational diabetes. There is a history of giving birth to very large babies or of losing pregnancies (having stillbirths). You have signs and symptoms of diabetes, such as: Changes in your eyesight. Tingling or numbness in your hands or feet. Changes in hunger, thirst, and urination, and these are not explained by your pregnancy. What is being tested? This test measures the amount of glucose in your blood at different times during a period of 3 hours. This shows how well your body can process glucose. What kind of sample is taken?  Blood samples are required for this test. They are usually collected by inserting a needle into a blood vessel. How do I prepare for this test? For 3 days before your test, eat normally. Have plenty of carbohydrate-rich foods. Follow instructions from your health care provider about: Eating or drinking restrictions on the day of the test. You may be asked not to eat or drink anything other than water (to fast) starting 8-10 hours before the test. Changing or stopping your regular medicines. Some medicines may interfere with this test. Tell a health care provider about: All medicines you are  taking, including vitamins, herbs, eye drops, creams, and over-the-counter medicines. Any blood disorders you have. Any surgeries you have had. Any medical conditions you have. What happens during the test? First, your blood glucose will be measured. This is referred to as your fasting blood glucose because you fasted before the test. Then, you will drink a glucose solution that contains a certain amount of glucose. Your blood glucose will be measured again 1, 2, and 3 hours after you drink the solution. This test takes about 3 hours to complete. You will need to stay at the testing location during this time. During the testing period: Do not eat or drink anything other than the glucose solution. Do not exercise. Do not use any products that contain nicotine or tobacco, such as cigarettes, e-cigarettes, and chewing tobacco. These can affect your test results. If you need help quitting, ask your health care provider. The testing procedure may vary among health care providers and hospitals. How are the results reported? Your results will be reported as milligrams of glucose per deciliter of blood (mg/dL) or millimoles per liter (mmol/L). There is more than one source for screening and diagnosis reference values used to diagnose gestational diabetes. Your health care provider will compare your results to normal values that were established after testing a large group of people (reference values). Reference values may vary among labs and hospitals. For this test (Carpenter-Coustan), reference values are: Fasting: 95 mg/dL (5.3 mmol/L). 1 hour: 180 mg/dL (10.0 mmol/L). 2 hour: 155 mg/dL (8.6 mmol/L). 3 hour: 140 mg/dL (7.8 mmol/L). What do the results mean? Results below the reference values are   considered normal. If two or more of your blood glucose levels are at or above the reference values, you may be diagnosed with gestational diabetes. If only one level is high, your health care provider may  suggest repeat testing or other tests to confirm a diagnosis. Talk with your health care provider about what your results mean. Questions to ask your health care provider Ask your health care provider, or the department that is doing the test: When will my results be ready? How will I get my results? What are my treatment options? What other tests do I need? What are my next steps? Summary The oral glucose tolerance test (OGTT) is one of several tests used to diagnose diabetes that develops during pregnancy (gestational diabetes mellitus). Gestational diabetes is a short-term form of diabetes that some women develop while they are pregnant. You may have the OGTT test after having a 1-hour glucose screening test if the results from that test show that you may have gestational diabetes. You may also have this test if you have any symptoms or risk factors for this type of diabetes. Talk with your health care provider about what your results mean. This information is not intended to replace advice given to you by your health care provider. Make sure you discuss any questions you have with your health care provider. Document Revised: 03/19/2020 Document Reviewed: 03/19/2020 Elsevier Patient Education  2023 Elsevier Inc.  

## 2022-07-18 ENCOUNTER — Other Ambulatory Visit: Payer: Self-pay

## 2022-07-18 DIAGNOSIS — Z3482 Encounter for supervision of other normal pregnancy, second trimester: Secondary | ICD-10-CM

## 2022-07-19 ENCOUNTER — Encounter: Payer: BC Managed Care – PPO | Admitting: Obstetrics

## 2022-07-20 ENCOUNTER — Encounter: Payer: Self-pay | Admitting: Obstetrics

## 2022-07-20 ENCOUNTER — Other Ambulatory Visit: Payer: Medicaid Other

## 2022-07-20 ENCOUNTER — Ambulatory Visit (INDEPENDENT_AMBULATORY_CARE_PROVIDER_SITE_OTHER): Payer: Medicaid Other | Admitting: Obstetrics

## 2022-07-20 VITALS — BP 110/72 | HR 90 | Wt 150.0 lb

## 2022-07-20 DIAGNOSIS — Z23 Encounter for immunization: Secondary | ICD-10-CM

## 2022-07-20 DIAGNOSIS — Z3480 Encounter for supervision of other normal pregnancy, unspecified trimester: Secondary | ICD-10-CM | POA: Insufficient documentation

## 2022-07-20 DIAGNOSIS — Z3A29 29 weeks gestation of pregnancy: Secondary | ICD-10-CM

## 2022-07-20 LAB — POCT URINALYSIS DIPSTICK OB
Bilirubin, UA: NEGATIVE
Blood, UA: NEGATIVE
Glucose, UA: NEGATIVE
Ketones, UA: NEGATIVE
Nitrite, UA: NEGATIVE
POC,PROTEIN,UA: NEGATIVE
Spec Grav, UA: 1.02 (ref 1.010–1.025)
Urobilinogen, UA: 0.2 E.U./dL
pH, UA: 8 (ref 5.0–8.0)

## 2022-07-20 NOTE — Progress Notes (Signed)
ROB at [redacted]w[redacted]d. Feeling well. Adrie has started a new job and is getting settled at home, and her stress/anxiety levels are improving. Baby is active. Discussed making a birth plan. Plans unmedicated birth. Breastfeeding. Undecided about contraception; considering pills, Depo, NuvaRing. RSB, TDaP, BTC done today. 1-hour glucose, CBC, and RPR drawn. RTC in 2 weeks.  Lloyd Huger, CNM

## 2022-07-21 ENCOUNTER — Encounter: Payer: Self-pay | Admitting: Certified Nurse Midwife

## 2022-07-21 LAB — RPR: RPR Ser Ql: NONREACTIVE

## 2022-07-21 LAB — CBC
Hematocrit: 29.8 % — ABNORMAL LOW (ref 34.0–46.6)
Hemoglobin: 10.2 g/dL — ABNORMAL LOW (ref 11.1–15.9)
MCH: 30.1 pg (ref 26.6–33.0)
MCHC: 34.2 g/dL (ref 31.5–35.7)
MCV: 88 fL (ref 79–97)
Platelets: 250 10*3/uL (ref 150–450)
RBC: 3.39 x10E6/uL — ABNORMAL LOW (ref 3.77–5.28)
RDW: 11.9 % (ref 11.7–15.4)
WBC: 12.1 10*3/uL — ABNORMAL HIGH (ref 3.4–10.8)

## 2022-07-21 LAB — GLUCOSE, 1 HOUR GESTATIONAL: Gestational Diabetes Screen: 143 mg/dL — ABNORMAL HIGH (ref 70–139)

## 2022-07-25 ENCOUNTER — Other Ambulatory Visit: Payer: Self-pay | Admitting: Certified Nurse Midwife

## 2022-07-25 ENCOUNTER — Encounter: Payer: Self-pay | Admitting: Certified Nurse Midwife

## 2022-07-25 ENCOUNTER — Encounter: Payer: Self-pay | Admitting: Obstetrics

## 2022-07-25 DIAGNOSIS — O99810 Abnormal glucose complicating pregnancy: Secondary | ICD-10-CM

## 2022-07-25 MED ORDER — FUSION PLUS PO CAPS: 1.0000 | ORAL_CAPSULE | Freq: Every day | ORAL | 9 refills | Status: DC

## 2022-07-27 ENCOUNTER — Other Ambulatory Visit: Payer: Medicaid Other

## 2022-07-27 DIAGNOSIS — O99810 Abnormal glucose complicating pregnancy: Secondary | ICD-10-CM

## 2022-07-28 ENCOUNTER — Other Ambulatory Visit: Payer: Self-pay

## 2022-07-28 MED ORDER — BLOOD GLUCOSE MONITOR KIT: PACK | 0 refills | Status: DC

## 2022-08-03 ENCOUNTER — Ambulatory Visit (INDEPENDENT_AMBULATORY_CARE_PROVIDER_SITE_OTHER): Payer: Medicaid Other | Admitting: Obstetrics

## 2022-08-03 ENCOUNTER — Encounter: Payer: Self-pay | Admitting: Obstetrics

## 2022-08-03 VITALS — BP 121/71 | HR 97 | Wt 153.0 lb

## 2022-08-03 DIAGNOSIS — Z3A31 31 weeks gestation of pregnancy: Secondary | ICD-10-CM

## 2022-08-03 DIAGNOSIS — Z3483 Encounter for supervision of other normal pregnancy, third trimester: Secondary | ICD-10-CM

## 2022-08-03 LAB — POCT URINALYSIS DIPSTICK OB
Glucose, UA: NEGATIVE
POC,PROTEIN,UA: NEGATIVE

## 2022-08-03 NOTE — Progress Notes (Signed)
ROB at [redacted]w[redacted]d. Active baby. Chabely denies LOF, vaginal bleeding. Brought BS log today; all BS except one fasting WNL. Will continue to monitor until next visit. Desires flu shot at next visit. Baby feels transverse today. Encouraged [employer]. Questions answered about RS vaccine. Plans to get this in a few weeks. RTC in 2 weeks.  Lloyd Huger, CNM

## 2022-08-03 NOTE — Addendum Note (Signed)
Addended by: Landis Gandy on: 08/03/2022 09:09 AM   Modules accepted: Orders

## 2022-08-17 ENCOUNTER — Encounter: Payer: Self-pay | Admitting: Obstetrics

## 2022-08-17 ENCOUNTER — Ambulatory Visit (INDEPENDENT_AMBULATORY_CARE_PROVIDER_SITE_OTHER): Payer: Medicaid Other | Admitting: Obstetrics

## 2022-08-17 VITALS — BP 116/67 | HR 87 | Wt 155.0 lb

## 2022-08-17 DIAGNOSIS — Z3A33 33 weeks gestation of pregnancy: Secondary | ICD-10-CM

## 2022-08-17 DIAGNOSIS — Z3483 Encounter for supervision of other normal pregnancy, third trimester: Secondary | ICD-10-CM

## 2022-08-17 DIAGNOSIS — Z23 Encounter for immunization: Secondary | ICD-10-CM | POA: Diagnosis not present

## 2022-08-17 LAB — POCT URINALYSIS DIPSTICK OB
Bilirubin, UA: NEGATIVE
Blood, UA: NEGATIVE
Glucose, UA: NEGATIVE
Ketones, UA: NEGATIVE
Leukocytes, UA: NEGATIVE
Nitrite, UA: NEGATIVE
POC,PROTEIN,UA: NEGATIVE
Spec Grav, UA: 1.015 (ref 1.010–1.025)
Urobilinogen, UA: 0.2 E.U./dL
pH, UA: 6.5 (ref 5.0–8.0)

## 2022-08-17 MED ORDER — RSV PRE-FUSION F A&B VAC RCMB 120 MCG/0.5ML IM SOLR: 0.5000 mL | Freq: Once | INTRAMUSCULAR | 0 refills | Status: AC

## 2022-08-17 NOTE — Progress Notes (Signed)
ROB at [redacted]w[redacted]d. Active baby. Denies ctx, LOF, and vaginal bleeding. Lori Dixon has continued to keep a blood sugar log, and all values except for 2 are WNL. Does not need to continue tracking. Staying active at work and with kids. Mood has been overall stable; she has been feeling a little weepy at times but is managing. Desires RSV shot. Rx sent to pharmacy. RTC in 2 weeks.  Lloyd Huger, CNM

## 2022-08-17 NOTE — Addendum Note (Signed)
Addended by: Landis Gandy on: 08/17/2022 09:17 AM   Modules accepted: Orders

## 2022-09-05 ENCOUNTER — Other Ambulatory Visit (HOSPITAL_COMMUNITY)
Admission: RE | Admit: 2022-09-05 | Discharge: 2022-09-05 | Disposition: A | Discharge status: Home / Self Care | Payer: Medicaid Other | Source: Ambulatory Visit | Attending: Certified Nurse Midwife | Admitting: Certified Nurse Midwife

## 2022-09-05 ENCOUNTER — Encounter: Payer: Self-pay | Admitting: Certified Nurse Midwife

## 2022-09-05 ENCOUNTER — Ambulatory Visit (INDEPENDENT_AMBULATORY_CARE_PROVIDER_SITE_OTHER): Payer: Medicaid Other | Admitting: Certified Nurse Midwife

## 2022-09-05 VITALS — BP 119/74 | HR 78 | Wt 158.4 lb

## 2022-09-05 DIAGNOSIS — Z3483 Encounter for supervision of other normal pregnancy, third trimester: Secondary | ICD-10-CM

## 2022-09-05 DIAGNOSIS — Z3A35 35 weeks gestation of pregnancy: Secondary | ICD-10-CM | POA: Insufficient documentation

## 2022-09-05 DIAGNOSIS — Z3493 Encounter for supervision of normal pregnancy, unspecified, third trimester: Secondary | ICD-10-CM | POA: Diagnosis not present

## 2022-09-05 LAB — POCT URINALYSIS DIPSTICK OB
Bilirubin, UA: NEGATIVE
Blood, UA: NEGATIVE
Glucose, UA: NEGATIVE
Ketones, UA: NEGATIVE
Leukocytes, UA: NEGATIVE
Nitrite, UA: NEGATIVE
POC,PROTEIN,UA: NEGATIVE
Spec Grav, UA: 1.02 (ref 1.010–1.025)
Urobilinogen, UA: 0.2 E.U./dL
pH, UA: 6.5 (ref 5.0–8.0)

## 2022-09-05 NOTE — Progress Notes (Signed)
ROB doing well , feeling movement. Discussed wkly visit until delivery. GBS and cultures self collected today. SVE today due to difficulty confirming presentation . Vertex on exam, did not actively check cervix- able to feel fetal head. Discussed exam in upcoming visit is pt desires. Herbal prep handout given . Follow up 1 wk for ROB.   Doreene Burke, CNM

## 2022-09-05 NOTE — Patient Instructions (Signed)

## 2022-09-06 LAB — CERVICOVAGINAL ANCILLARY ONLY
Chlamydia: NEGATIVE
Comment: NEGATIVE
Comment: NORMAL
Neisseria Gonorrhea: NEGATIVE

## 2022-09-09 LAB — CULTURE, BETA STREP (GROUP B ONLY): Strep Gp B Culture: NEGATIVE

## 2022-09-12 ENCOUNTER — Telehealth: Payer: Self-pay | Admitting: Advanced Practice Midwife

## 2022-09-12 NOTE — Telephone Encounter (Signed)
Reached out to pt to reschedule 12/6 ROB appt with JEG.  (Provider on call.)  Left message for pt to call back to reschedule.

## 2022-09-13 ENCOUNTER — Encounter: Payer: Self-pay | Admitting: Certified Nurse Midwife

## 2022-09-13 ENCOUNTER — Ambulatory Visit (INDEPENDENT_AMBULATORY_CARE_PROVIDER_SITE_OTHER): Payer: Medicaid Other | Admitting: Certified Nurse Midwife

## 2022-09-13 VITALS — BP 107/67 | HR 85 | Wt 159.9 lb

## 2022-09-13 DIAGNOSIS — Z3A36 36 weeks gestation of pregnancy: Secondary | ICD-10-CM

## 2022-09-13 DIAGNOSIS — R82998 Other abnormal findings in urine: Secondary | ICD-10-CM | POA: Diagnosis not present

## 2022-09-13 DIAGNOSIS — Z3483 Encounter for supervision of other normal pregnancy, third trimester: Secondary | ICD-10-CM

## 2022-09-13 LAB — POCT URINALYSIS DIPSTICK OB
Bilirubin, UA: NEGATIVE
Blood, UA: NEGATIVE
Glucose, UA: NEGATIVE
Nitrite, UA: NEGATIVE
Spec Grav, UA: 1.01 (ref 1.010–1.025)
Urobilinogen, UA: 0.2 E.U./dL
pH, UA: 7 (ref 5.0–8.0)

## 2022-09-13 NOTE — Progress Notes (Signed)
ROB doing well. Feeling good movement. Labor precautions reviewed. SVE per pt request 2/50/-2 station. She denies any concerns today. Follow up 1 wks for ROB.   Doreene Burke, CNM

## 2022-09-13 NOTE — Telephone Encounter (Signed)
Pt is rescheduled to [place] on 12/6 at 8:15.

## 2022-09-13 NOTE — Patient Instructions (Signed)
Braxton Hicks Contractions  Contractions of the uterus can occur throughout pregnancy, but they are not always a sign that you are in labor. You may have practice contractions called Braxton Hicks contractions. These false labor contractions are sometimes confused with true labor. What are Braxton Hicks contractions? Braxton Hicks contractions are tightening movements that occur in the muscles of the uterus before labor. Unlike true labor contractions, these contractions do not result in opening (dilation) and thinning of the lowest part of the uterus (cervix). Toward the end of pregnancy (32-34 weeks), Braxton Hicks contractions can happen more often and may become stronger. These contractions are sometimes difficult to tell apart from true labor because they can be very uncomfortable. How to tell the difference between true labor and false labor True labor Contractions last 30-70 seconds. Contractions become very regular. Discomfort is usually felt in the top of the uterus, and it spreads to the lower abdomen and low back. Contractions do not go away with walking. Contractions usually become stronger and more frequent. The cervix dilates and gets thinner. False labor Contractions are usually shorter, weaker, and farther apart than true labor contractions. Contractions are usually irregular. Contractions are often felt in the front of the lower abdomen and in the groin. Contractions may go away when you walk around or change positions while lying down. The cervix usually does not dilate or become thin. Sometimes, the only way to tell if you are in true labor is for your health care provider to look for changes in your cervix. Your health care provider will do a physical exam and may monitor your contractions. If you are in true labor, your health care provider will send you home with instructions about when to return to the hospital. You may continue to have Braxton Hicks contractions until you  go into true labor. Follow these instructions at home:  Take over-the-counter and prescription medicines only as told by your health care provider. If Braxton Hicks contractions are making you uncomfortable: Change your position from lying down or resting to walking, or change from walking to resting. Sit and rest in a tub of warm water. Drink enough fluid to keep your urine pale yellow. Dehydration may cause these contractions. Do slow and deep breathing several times an hour. Keep all follow-up visits. This is important. Contact a health care provider if: You have a fever. You have continuous pain in your abdomen. Your contractions become stronger, more regular, and closer together. You pass blood-tinged mucus. Get help right away if: You have fluid leaking or gushing from your vagina. You have bright red blood coming from your vagina. Your baby is not moving inside you as much as it used to. Summary You may have practice contractions called Braxton Hicks contractions. These false labor contractions are sometimes confused with true labor. Braxton Hicks contractions are usually shorter, weaker, farther apart, and less regular than true labor contractions. True labor contractions usually become stronger, more regular, and more frequent. Manage discomfort from Braxton Hicks contractions by changing position, resting in a warm bath, practicing deep breathing, and drinking plenty of water. Keep all follow-up visits. Contact your health care provider if your contractions become stronger, more regular, and closer together. This information is not intended to replace advice given to you by your health care provider. Make sure you discuss any questions you have with your health care provider. Document Revised: 08/17/2020 Document Reviewed: 08/17/2020 Elsevier Patient Education  2023 Elsevier Inc.  

## 2022-09-13 NOTE — Addendum Note (Signed)
Addended by: Fonda Kinder on: 09/13/2022 03:24 PM   Modules accepted: Orders

## 2022-09-13 NOTE — Telephone Encounter (Signed)
I contacted patient via phone. Left message for patient to call back to confirm provider scheduled change.

## 2022-09-15 LAB — URINE CULTURE

## 2022-09-22 ENCOUNTER — Ambulatory Visit (INDEPENDENT_AMBULATORY_CARE_PROVIDER_SITE_OTHER): Payer: Medicaid Other | Admitting: Licensed Practical Nurse

## 2022-09-22 ENCOUNTER — Encounter: Payer: Self-pay | Admitting: Licensed Practical Nurse

## 2022-09-22 VITALS — BP 124/86 | HR 74 | Wt 158.4 lb

## 2022-09-22 DIAGNOSIS — O99343 Other mental disorders complicating pregnancy, third trimester: Secondary | ICD-10-CM

## 2022-09-22 DIAGNOSIS — Z3A38 38 weeks gestation of pregnancy: Secondary | ICD-10-CM

## 2022-09-22 DIAGNOSIS — Z3480 Encounter for supervision of other normal pregnancy, unspecified trimester: Secondary | ICD-10-CM

## 2022-09-22 DIAGNOSIS — F329 Major depressive disorder, single episode, unspecified: Secondary | ICD-10-CM

## 2022-09-22 DIAGNOSIS — O09293 Supervision of pregnancy with other poor reproductive or obstetric history, third trimester: Secondary | ICD-10-CM

## 2022-09-22 LAB — POCT URINALYSIS DIPSTICK
Bilirubin, UA: NEGATIVE
Blood, UA: NEGATIVE
Glucose, UA: NEGATIVE
Ketones, UA: NEGATIVE
Leukocytes, UA: NEGATIVE
Nitrite, UA: NEGATIVE
Protein, UA: NEGATIVE
Spec Grav, UA: 1.01 (ref 1.010–1.025)
Urobilinogen, UA: 0.2 E.U./dL
pH, UA: 6.5 (ref 5.0–8.0)

## 2022-09-22 NOTE — Progress Notes (Signed)
Routine Prenatal Care Visit  Subjective  Lori Dixon is a 31 y.o. [phone] at [redacted]w[redacted]d being seen today for ongoing prenatal care.  She is currently monitored for the following issues for this low-risk pregnancy and has Depression, recurrent (HCC); History of suicide attempt; PCOS (polycystic ovarian syndrome); and Supervision of other normal pregnancy, antepartum on their problem list.  ----------------------------------------------------------------------------------- Patient reports fatigue.  Doing well.  Admits to being a little nervous about birthing in a hospital. -This is her partner's first baby, they will have lots of support at Home  -her girls will be with their dad when labor happens.  -desires VE and sweep  Contractions: Irritability. Vag. Bleeding: Bloody Show.  Movement: Present. Leaking Fluid denies.  ----------------------------------------------------------------------------------- The following portions of the patient's history were reviewed and updated as appropriate: allergies, current medications, past family history, past medical history, past social history, past surgical history and problem list. Problem list updated.  Objective  Blood pressure 124/86, pulse 74, weight 158 lb 6.4 oz (71.8 kg). Pregravid weight 140 lb (63.5 kg) Total Weight Gain 18 lb 6.4 oz (8.346 kg) Urinalysis: Urine Protein    Urine Glucose    Fetal Status: Fetal Heart Rate (bpm): 130 Fundal Height: 37 cm Movement: Present     General:  Alert, oriented and cooperative. Patient is in no acute distress.  Skin: Skin is warm and dry. No rash noted.   Cardiovascular: Normal heart rate noted  Respiratory: Normal respiratory effort, no problems with respiration noted  Abdomen: Soft, gravid, appropriate for gestational age. Pain/Pressure: Present     Pelvic:  Cervical exam performed Dilation: 3 (3) Effacement (%): 50 Station: -2, swept   Extremities: Normal range of motion.  Edema: Trace  Mental Status:  Normal mood and affect. Normal behavior. Normal judgment and thought content.   Assessment   31 y.o. [id number] at [redacted]w[redacted]d by  10/05/2022, by Ultrasound presenting for routine prenatal visit  Plan   g4 Problems (from 01/26/22 to present)     No problems associated with this episode.        Term labor symptoms and general obstetric precautions including but not limited to vaginal bleeding, contractions, leaking of fluid and fetal movement were reviewed in detail with the patient. Please refer to After Visit Summary for other counseling recommendations.   Return in about 1 week (around 09/29/2022) for ROB. Carie Caddy, CNM  [place] Pulse, [facility]  09/22/22  1:36 PM

## 2022-09-22 NOTE — Addendum Note (Signed)
Addended by: Burtis Junes on: 09/22/2022 01:39 PM   Modules accepted: Orders

## 2022-09-25 ENCOUNTER — Other Ambulatory Visit: Payer: Self-pay

## 2022-09-25 ENCOUNTER — Inpatient Hospital Stay
Admission: EM | Admit: 2022-09-25 | Discharge: 2022-09-26 | DRG: 807 | Disposition: A | Discharge status: Home / Self Care | Payer: Medicaid Other | Attending: Obstetrics | Admitting: Obstetrics

## 2022-09-25 ENCOUNTER — Encounter: Payer: Self-pay | Admitting: Obstetrics

## 2022-09-25 DIAGNOSIS — Z3A38 38 weeks gestation of pregnancy: Secondary | ICD-10-CM

## 2022-09-25 DIAGNOSIS — O26893 Other specified pregnancy related conditions, third trimester: Secondary | ICD-10-CM | POA: Diagnosis not present

## 2022-09-25 DIAGNOSIS — O4202 Full-term premature rupture of membranes, onset of labor within 24 hours of rupture: Secondary | ICD-10-CM

## 2022-09-25 DIAGNOSIS — O9902 Anemia complicating childbirth: Secondary | ICD-10-CM | POA: Diagnosis not present

## 2022-09-25 LAB — CBC
HCT: 32.6 % — ABNORMAL LOW (ref 36.0–46.0)
Hemoglobin: 10.3 g/dL — ABNORMAL LOW (ref 12.0–15.0)
MCH: 26.3 pg (ref 26.0–34.0)
MCHC: 31.6 g/dL (ref 30.0–36.0)
MCV: 83.2 fL (ref 80.0–100.0)
Platelets: 283 10*3/uL (ref 150–400)
RBC: 3.92 MIL/uL (ref 3.87–5.11)
RDW: 14.7 % (ref 11.5–15.5)
WBC: 15.5 10*3/uL — ABNORMAL HIGH (ref 4.0–10.5)
nRBC: 0 % (ref 0.0–0.2)

## 2022-09-25 LAB — TYPE AND SCREEN
ABO/RH(D): A POS
Antibody Screen: NEGATIVE

## 2022-09-25 LAB — RUPTURE OF MEMBRANE (ROM)PLUS: Rom Plus: POSITIVE

## 2022-09-25 MED ORDER — OXYCODONE-ACETAMINOPHEN 5-325 MG PO TABS: 1.0000 | ORAL_TABLET | ORAL | Status: DC | PRN

## 2022-09-25 MED ORDER — LACTATED RINGERS IV SOLN: 500.0000 mL | INTRAVENOUS | Status: DC | PRN

## 2022-09-25 MED ORDER — AMMONIA AROMATIC IN INHA
RESPIRATORY_TRACT | Status: AC
  Filled 2022-09-25: qty 10

## 2022-09-25 MED ORDER — MISOPROSTOL 200 MCG PO TABS
ORAL_TABLET | ORAL | Status: AC
  Filled 2022-09-25: qty 4

## 2022-09-25 MED ORDER — ACETAMINOPHEN 325 MG PO TABS: 650.0000 mg | ORAL_TABLET | ORAL | Status: DC | PRN

## 2022-09-25 MED ORDER — COCONUT OIL OIL: 1.0000 | TOPICAL_OIL | Status: DC | PRN

## 2022-09-25 MED ORDER — DOCUSATE SODIUM 100 MG PO CAPS
100.0000 mg | ORAL_CAPSULE | Freq: Two times a day (BID) | ORAL | Status: DC
  Administered 2022-09-25 – 2022-09-26 (×2): 100 mg via ORAL
  Filled 2022-09-25 (×2): qty 1

## 2022-09-25 MED ORDER — FENTANYL CITRATE (PF) 100 MCG/2ML IJ SOLN
50.0000 ug | INTRAMUSCULAR | Status: DC | PRN
  Administered 2022-09-25: 50 ug via INTRAVENOUS
  Filled 2022-09-25 (×2): qty 2

## 2022-09-25 MED ORDER — LACTATED RINGERS IV SOLN: INTRAVENOUS | Status: DC

## 2022-09-25 MED ORDER — SOD CITRATE-CITRIC ACID 500-334 MG/5ML PO SOLN: 30.0000 mL | ORAL | Status: DC | PRN

## 2022-09-25 MED ORDER — PRENATAL MULTIVITAMIN CH
1.0000 | ORAL_TABLET | Freq: Every day | ORAL | Status: DC
  Administered 2022-09-25 – 2022-09-26 (×2): 1 via ORAL
  Filled 2022-09-25 (×2): qty 1

## 2022-09-25 MED ORDER — ONDANSETRON HCL 4 MG/2ML IJ SOLN
4.0000 mg | Freq: Four times a day (QID) | INTRAMUSCULAR | Status: DC | PRN
  Administered 2022-09-25: 4 mg via INTRAVENOUS
  Filled 2022-09-25 (×2): qty 2

## 2022-09-25 MED ORDER — IBUPROFEN 600 MG PO TABS
600.0000 mg | ORAL_TABLET | Freq: Four times a day (QID) | ORAL | Status: DC
  Administered 2022-09-25 – 2022-09-26 (×6): 600 mg via ORAL
  Filled 2022-09-25 (×6): qty 1

## 2022-09-25 MED ORDER — LIDOCAINE HCL (PF) 1 % IJ SOLN
30.0000 mL | INTRAMUSCULAR | Status: DC | PRN
  Filled 2022-09-25: qty 30

## 2022-09-25 MED ORDER — BUSPIRONE HCL 5 MG PO TABS
5.0000 mg | ORAL_TABLET | Freq: Three times a day (TID) | ORAL | Status: DC
  Filled 2022-09-25 (×5): qty 1

## 2022-09-25 MED ORDER — METHYLERGONOVINE MALEATE 0.2 MG/ML IJ SOLN: 0.2000 mg | INTRAMUSCULAR | Status: DC | PRN

## 2022-09-25 MED ORDER — LACTATED RINGERS IV SOLN
500.0000 mL | INTRAVENOUS | Status: DC | PRN
  Administered 2022-09-25: 500 mL via INTRAVENOUS

## 2022-09-25 MED ORDER — WITCH HAZEL-GLYCERIN EX PADS
MEDICATED_PAD | CUTANEOUS | Status: DC | PRN
  Filled 2022-09-25: qty 100

## 2022-09-25 MED ORDER — HYDROXYZINE HCL 25 MG PO TABS: 50.0000 mg | ORAL_TABLET | Freq: Four times a day (QID) | ORAL | Status: DC | PRN

## 2022-09-25 MED ORDER — OXYTOCIN BOLUS FROM INFUSION
333.0000 mL | Freq: Once | INTRAVENOUS | Status: AC
  Administered 2022-09-25: 333 mL via INTRAVENOUS

## 2022-09-25 MED ORDER — SIMETHICONE 80 MG PO CHEW: 80.0000 mg | CHEWABLE_TABLET | ORAL | Status: DC | PRN

## 2022-09-25 MED ORDER — METHYLERGONOVINE MALEATE 0.2 MG PO TABS: 0.2000 mg | ORAL_TABLET | ORAL | Status: DC | PRN

## 2022-09-25 MED ORDER — OXYTOCIN-SODIUM CHLORIDE 30-0.9 UT/500ML-% IV SOLN
2.5000 [IU]/h | INTRAVENOUS | Status: DC | PRN
  Administered 2022-09-25: 2.5 [IU]/h via INTRAVENOUS

## 2022-09-25 MED ORDER — OXYTOCIN-SODIUM CHLORIDE 30-0.9 UT/500ML-% IV SOLN
2.5000 [IU]/h | INTRAVENOUS | Status: DC
  Administered 2022-09-25: 2.5 [IU]/h via INTRAVENOUS
  Filled 2022-09-25: qty 500

## 2022-09-25 MED ORDER — OXYTOCIN 10 UNIT/ML IJ SOLN
INTRAMUSCULAR | Status: AC
  Filled 2022-09-25: qty 2

## 2022-09-25 MED ORDER — ONDANSETRON HCL 4 MG PO TABS: 4.0000 mg | ORAL_TABLET | ORAL | Status: DC | PRN

## 2022-09-25 MED ORDER — DIPHENHYDRAMINE HCL 25 MG PO CAPS: 25.0000 mg | ORAL_CAPSULE | Freq: Four times a day (QID) | ORAL | Status: DC | PRN

## 2022-09-25 MED ORDER — ONDANSETRON HCL 4 MG/2ML IJ SOLN: 4.0000 mg | INTRAMUSCULAR | Status: DC | PRN

## 2022-09-25 MED ORDER — BENZOCAINE-MENTHOL 20-0.5 % EX AERO
1.0000 | INHALATION_SPRAY | CUTANEOUS | Status: DC | PRN
  Administered 2022-09-25: 1 via TOPICAL
  Filled 2022-09-25: qty 56

## 2022-09-25 NOTE — Discharge Summary (Shared)
Postpartum Discharge Summary  Date of Service updated***     Patient Name: Lori Dixon DOB: 1991/04/02 MRN: [id number]  Date of admission: 09/25/2022 Delivery date:09/25/2022  Delivering provider: Lurlean Horns  Date of discharge: ***  Admitting diagnosis: Labor and delivery, indication for care [O75.9] Intrauterine pregnancy: [redacted]w[redacted]d    Secondary diagnosis:  Principal Problem:   Labor and delivery, indication for care  Additional problems: None    Discharge diagnosis: Term Pregnancy Delivered and Anemia                                              Post partum procedures:{Postpartum procedures:23558} Augmentation: N/A Complications: None  Hospital course: Onset of Labor With Vaginal Delivery      31y.o. yo [phone] 30w4das admitted in Active Labor on 09/25/2022. Labor course was uncomplicated.  Membrane Rupture Time/Date: 11:58 PM ,09/24/2022   Delivery Method:Vaginal, Spontaneous  Episiotomy: None  Lacerations:  None  Patient had a postpartum course complicated by ***.  She is ambulating, tolerating a regular diet, passing flatus, and urinating well. Patient is discharged home in stable condition on 09/25/22.  Newborn Data: Birth date:09/25/2022  Birth time:4:28 AM  Gender:Female  Living status:Living  Apgars:9 ,9  Weight:   Magnesium Sulfate received: No BMZ received: No Rhophylac:N/A MMR:N/A T-DaP:Given prenatally Flu: No Transfusion:{Transfusion received:30440034}  Physical exam  Vitals:   09/25/22 0158 09/25/22 0414 09/25/22 0434 09/25/22 0436  BP: 127/86  115/70 117/63  Pulse: 86  81 80  Resp: 18     Temp: 97.9 F (36.6 C) 98.2 F (36.8 C)    TempSrc: Oral Oral    Weight: 71.9 kg     Height: _0  (1.549 m)      General: {Exam; general:21111117} Lochia: {Desc; appropriate/inappropriate:30686::"appropriate"} Uterine Fundus: {Desc; firm/soft:30687} Incision: {Exam; incision:21111123} DVT Evaluation: {Exam; dvWEX:9371696}abs: Lab  Results  Component Value Date   WBC 15.5 (H) 09/25/2022   HGB 10.3 (L) 09/25/2022   HCT 32.6 (L) 09/25/2022   MCV 83.2 09/25/2022   PLT 283 09/25/2022      Latest Ref Rng & Units 07/14/2021   10:01 AM  CMP  Glucose 65 - 99 mg/dL 81   BUN 6 - 20 mg/dL 8   Creatinine 0.57 - 1.00 mg/dL 0.92   Sodium 134 - 144 mmol/L 139   Potassium 3.5 - 5.2 mmol/L 4.1   Chloride 96 - 106 mmol/L 103   CO2 20 - 29 mmol/L 23   Calcium 8.7 - 10.2 mg/dL 9.0   Total Protein 6.0 - 8.5 g/dL 7.3   Total Bilirubin 0.0 - 1.2 mg/dL <0.2   Alkaline Phos 44 - 121 IU/L 55   AST 0 - 40 IU/L 12   ALT 0 - 32 IU/L 14    Edinburgh Score:     No data to display            After visit meds:  Allergies as of 09/25/2022   No Known Allergies   Med Rec must be completed prior to [facility]        Discharge home in stable condition Infant Feeding: {Baby feeding:23562} Infant Disposition:{CHL IP OB HOME WITH MOVELFYB:01751}ischarge instruction: per After Visit Summary and Postpartum booklet. Activity: Advance as tolerated. Pelvic rest for 6 weeks.  Diet: {OB diet:21111121} Anticipated Birth Control: {Birth Control:23956}  Postpartum Appointment:{Outpatient follow up:23559} Additional Postpartum F/U: {PP Procedure:23957} Future Appointments: Future Appointments  Date Time Provider [place]  09/28/2022  8:15 AM Dominic, Nunzio Cobbs, CNM [facility] None  10/03/2022  8:15 AM Rod Can, CNM [facility] None   Follow up Visit:      SIGNED:

## 2022-09-25 NOTE — H&P (Signed)
History and Physical   HPI  Lori Dixon is a 31 y.o. V6H2094 at 51w4dEstimated Date of Delivery: 10/05/22 who is being admitted for labor and SROM. She reports that she had a gush of clear fluid at 0058, followed by a few other small gushes. She has noted some bloody show. Contractions started about an hour later. She endorses good fetal movement. She denies s/s of infection.  OB History  OB History  Gravida Para Term Preterm AB Living  _0 0 0 3  SAB IAB Ectopic Multiple Live Births  0 0 0 0 3    # Outcome Date GA Lbr Len/2nd Weight Sex Delivery Anes PTL Lv  4 Current           3 Term 05/26/19   3402 g F Vag-Spont  N LIV  2 Term 05/13/17   3685 g F Vag-Spont  N LIV  1 Term 10/05/12   3260 g F Vag-Spont  N LIV    PROBLEM LIST  Pregnancy complications or risks: Patient Active Problem List   Diagnosis Date Noted   Labor and delivery, indication for care 09/25/2022   Supervision of other normal pregnancy, antepartum 07/20/2022   PCOS (polycystic ovarian syndrome) 03/25/2020   Depression, recurrent [place] 02/06/2020   History of suicide attempt 02/06/2020    Prenatal labs and studies: ABO, Rh: A/Positive/-- (05/19 1523) Antibody: Negative (05/19 1523) Rubella: 7.78 (05/19 1523) RPR: Non Reactive (09/27 0931)  HBsAg: Negative (05/19 1523)  HIV: Non Reactive (05/19 1523)  GBSJ:GGEZMOQH/- (11/13 0900)   Past Medical History:  Diagnosis Date   Anxiety    Depression    Heart murmur    AS A CHILD   Leaky heart valve      Past Surgical History:  Procedure Laterality Date   HYSTEROSCOPY N/A 02/04/2016   Procedure: HYSTEROSCOPY WITH MYOSURE;  Surgeon: AMalachy Mood MD;  Location: [facility] ORS;  Service: Gynecology;  Laterality: N/A;   LAPAROSCOPY N/A 02/04/2016   Procedure: LAPAROSCOPY DIAGNOSTIC;  Surgeon: AMalachy Mood MD;  Location: [facility] ORS;  Service: Gynecology;  Laterality: N/A;   NO PAST SURGERIES       Medications    Current Discharge Medication  List     CONTINUE these medications which have NOT CHANGED   Details  busPIRone (BUSPAR) 5 MG tablet Take 1 tablet (5 mg total) by mouth 3 (three) times daily. Qty: 90 tablet, Refills: 1    Iron-FA-B Cmp-C-Biot-Probiotic (FUSION PLUS) CAPS Take 1 tablet by mouth daily. Qty: 30 capsule, Refills: 9    prenatal vitamin w/FE, FA (PRENATAL 1 + 1) 27-1 MG TABS tablet Take 1 tablet by mouth daily at 12 noon.    ACCU-CHEK GUIDE test strip SMARTSIG:Via Meter 1-4 Times Daily    Accu-Chek Softclix Lancets lancets SMARTSIG:Via Meter 1-4 Times Daily    blood glucose meter kit and supplies KIT Dispense based on patient and insurance preference. Use up to four times daily as directed. Qty: 1 each, Refills: 0    ondansetron (ZOFRAN) 4 MG tablet Take 1 tablet (4 mg total) by mouth every 8 (eight) hours as needed for nausea or vomiting. Qty: 20 tablet, Refills: 2         Allergies  Patient has no known allergies.  Review of Systems  Constitutional: negative for anorexia, chills, fatigue, and fevers Respiratory: negative for cough and wheezing Cardiovascular: negative for chest pain, dyspnea, and palpitations Gastrointestinal: positive for contractions, negative for constipation, diarrhea, and nausea Genitourinary:negative Musculoskeletal:negative  Behavioral/Psych: reports mood is stable  Physical Exam  BP 127/86 (BP Location: Right Arm)   Pulse 86   Temp 97.9 F (36.6 C) (Oral)   Resp 18   Ht _0  (1.549 m)   Wt 71.9 kg   LMP  (LMP Unknown)   BMI 29.93 kg/m   Lungs:  CTAB Cardio: RRR without M/R/G Abd: Soft, gravid, NT Presentation: cephalic  DTRs:  CERVIX: Dilation: 4 Effacement (%): 80 Station: -1 Presentation: Vertex Exam by:: Missy Leotha Voeltz CNM  See Prenatal records for more detailed PE.     FHR:  Baseline: 110 Variability: moderate Accelerations: present Decelerations: absent Toco: regular, every 2-4 minutes Category 1  Test Results  ROM Plus:  Positive Group B Strep negative  Assessment   G4P3003 at 91w4dEstimated Date of Delivery: 10/05/22  Reassuring maternal/fetal status.  Patient Active Problem List   Diagnosis Date Noted   Labor and delivery, indication for care 09/25/2022   Supervision of other normal pregnancy, antepartum 07/20/2022   PCOS (polycystic ovarian syndrome) 03/25/2020   [place]ression, recurrent ([place] 02/06/2020   History of suicide attempt 02/06/2020    Plan  1. Admit to L&D 2. EFM: Category 1. Intermittent auscultation after reactive NST 3. Pharmacologic pain relief if desired.   4. Admission labs  5. Anticipate NSVD 6. Dr. EAmalia Haileynotified of admission  MLloyd Huger C[facility]12/12/2021 2:04 AM

## 2022-09-25 NOTE — Lactation Note (Signed)
This note was copied from a baby's chart. Lactation Consultation Note  Patient Name: Lori Dixon Date: 09/25/2022 Reason for consult: Follow-up assessment Age:30 hours  Maternal Data    Feeding Mother's Current Feeding Choice: Breast Milk Latched easily to left breast without my assist, in cradle hold, swallows seen and heard, nursed 10 min then bath given by Minerva NT, mom will offer breast after bath LATCH Score Latch: Grasps breast easily, tongue down, lips flanged, rhythmical sucking.  Audible Swallowing: Spontaneous and intermittent  Type of Nipple: Everted at rest and after stimulation  Comfort (Breast/Nipple): Soft / non-tender  Hold (Positioning): No assistance needed to correctly position infant at breast.  LATCH Score: 10   Lactation Tools Discussed/Used    Interventions Interventions: Breast feeding basics reviewed;Education  Discharge Pump: Personal  Consult Status Consult Status: PRN    Dyann Kief 09/25/2022, 3:35 PM

## 2022-09-25 NOTE — Lactation Note (Signed)
This note was copied from a baby's chart. Lactation Consultation Note  Patient Name: Lori Dixon GGEZM'O Date: 09/25/2022 Reason for consult: Initial assessment;Early term 37-38.6wks Age:30 hours  Maternal Data Does the patient have breastfeeding experience prior to this delivery?: Yes How long did the patient breastfeed?: 8 mths  Feeding Mother's Current Feeding Choice: Breast Milk Baby rooting and showing cues, mom states she (baby) has been latching well, states she hears swallows when feeding at the breast, mom breastfed her other 3 children with no problems LATCH Score Latch:  (I did not observe a feeding)                  Lactation Tools Discussed/Used  LC name and number written on white board, mom encouraged to call for any concerns, questions or for assistance   Interventions Interventions: Breast feeding basics reviewed  Discharge WIC Program: Yes  Consult Status Consult Status: PRN    Dyann Kief 09/25/2022, 11:04 AM

## 2022-09-25 NOTE — OB Triage Note (Signed)
Lori Dixon 31 y.o. presents to Labor & Delivery triage via wheelchair steered by ED staff reporting LOF since 2358. She states she has had increasing contractions since that time. She is a G4P3003 at [redacted]w[redacted]d . She denies signs and symptoms consistent with active vaginal bleeding. She states positive fetal movement. External FM and TOCO applied to non-tender abdomen. Vital signs obtained and within normal limits. Patient oriented to care environment including call bell and bed control use. Acquanetta Sit, CNM at bedside to assess.

## 2022-09-26 LAB — RPR: RPR Ser Ql: NONREACTIVE

## 2022-09-26 NOTE — Progress Notes (Signed)
Patient discharged home with infant. Discharge instructions and prescriptions given and reviewed with patient. Patient verbalized understanding. Escorted out by volunteer.  

## 2022-09-26 NOTE — Discharge Instructions (Signed)
Discharge Instructions:   If there are any new medications, they have been ordered and will be available for pickup at the listed pharmacy on your way home from the hospital.   Call office if you have any of the following: headache, visual changes, fever >101.0 F, chills, shortness of breath, breast concerns, excessive vaginal bleeding, incision drainage or problems, leg pain or redness, depression or any other concerns. If you have vaginal discharge with an odor, let your doctor know.   It is normal to bleed for up to 6 weeks. You should not soak through more than 1 pad in 1 hour. If you have a blood clot larger than your fist with continued bleeding, call your doctor.   After a c-section, you should expect a small amount of blood or clear fluid coming from the incision and abdominal cramping/soreness. Inspect your incision site daily. Stand in front of a mirror to look for any redness, incision opening, or discolored/odorness drainage. Take a shower daily and continue good hygiene. Use own towel and washcloth (do not share). Make sure your sheets on your bed are clean. No pets sleeping around your incision site. Dressing will be removed at your postpartum visit. If the dressing does become wet or soiled underneath, it is okay to remove it.   On-Q pump: You will remove on day 5 after insertion or if the ball becomes flat before day 5. You will remove on: Feb 24, 2019  Activity: Do not lift > 10 lbs for 6 weeks (do not lift anything heavier than your baby). No intercourse, tampons, swimming pools, hot tubs, baths (only showers) for 6 weeks.  No driving for 1-2 weeks. Continue prenatal vitamin, especially if breastfeeding. Increase calories and fluids (water) while breastfeeding.   Your milk will come in, in the next couple of days (right now it is colostrum). You may have a slight fever when your milk comes in, but it should go away on its own.  If it does not, and rises above 101 F please call the  doctor. You will also feel achy and your breasts will be firm. They will also start to leak. If you are breastfeeding, continue as you have been and you can pump/express milk for comfort.   If you have too much milk, your breasts can become engorged, which could lead to mastitis. This is an infection of the milk ducts. It can be very painful and you will need to notify your doctor to obtain a prescription for antibiotics. You can also treat it with a shower or hot/cold compress.   For concerns about your baby, please call your pediatrician.  For breastfeeding concerns, the lactation consultant can be reached at [phone].   Postpartum blues (feelings of happy one minute and sad another minute) are normal for the first few weeks but if it gets worse let your doctor know.   Congratulations! We enjoyed caring for you and your new bundle of joy!  

## 2022-09-28 ENCOUNTER — Encounter: Payer: Medicaid Other | Admitting: Licensed Practical Nurse

## 2022-09-28 ENCOUNTER — Encounter: Payer: Medicaid Other | Admitting: Advanced Practice Midwife

## 2022-09-28 ENCOUNTER — Encounter: Payer: Medicaid Other | Admitting: Obstetrics and Gynecology

## 2022-10-03 ENCOUNTER — Encounter: Payer: Medicaid Other | Admitting: Advanced Practice Midwife

## 2022-10-11 IMAGING — US US OB < 14 WEEKS - US OB TV
1 series · 14 of 28 positions shown · non-contrast
Comparison: 01/26/2022

CLINICAL DATA: Unsure last menstrual., dating

EXAM:
OBSTETRIC <14 WK US AND TRANSVAGINAL OB US
TECHNIQUE: Both transabdominal and transvaginal ultrasound examinations were
performed for complete evaluation of the gestation as well as the
maternal uterus, adnexal regions, and pelvic cul-de-sac.
Transvaginal technique was performed to assess early pregnancy.

[Series 1: us ob < 14 weeks - us ob tv · 0.11mm/px · 14 of 105 slices shown]
[im 4/105]
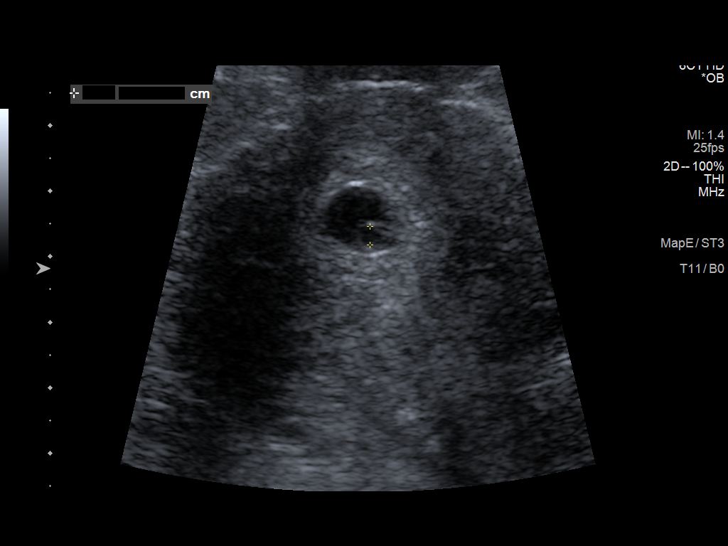
[im 12/105]
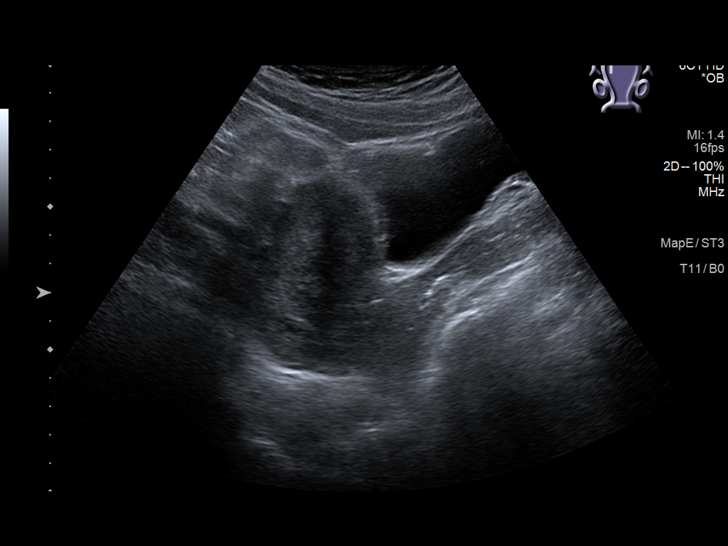
[im 20/105]
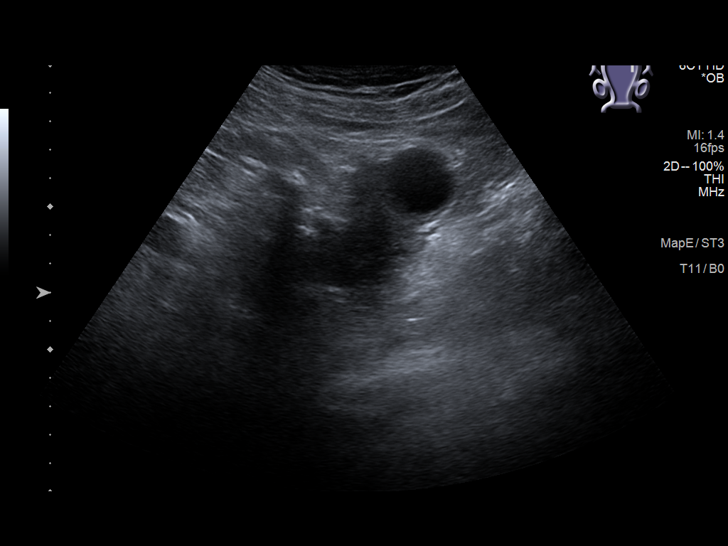
[im 27/105]
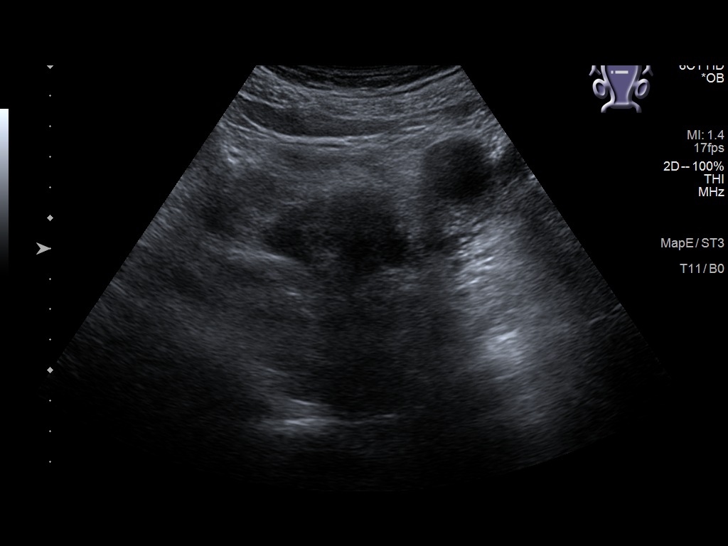
[im 35/105]
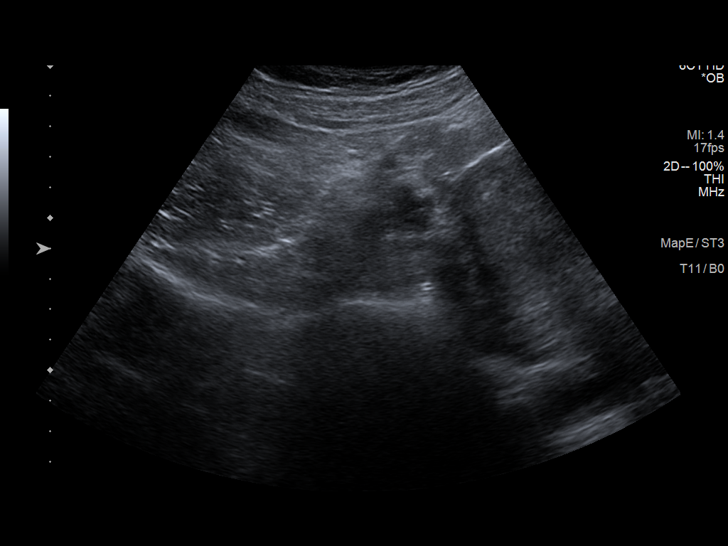
[im 43/105]
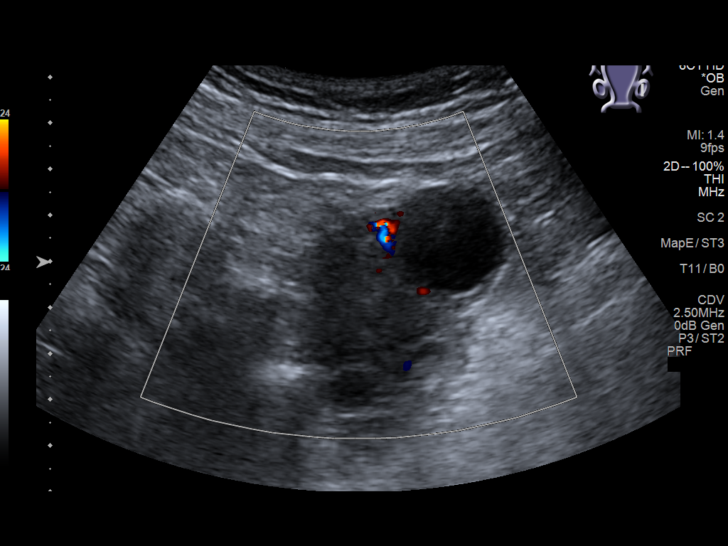
[im 51/105]
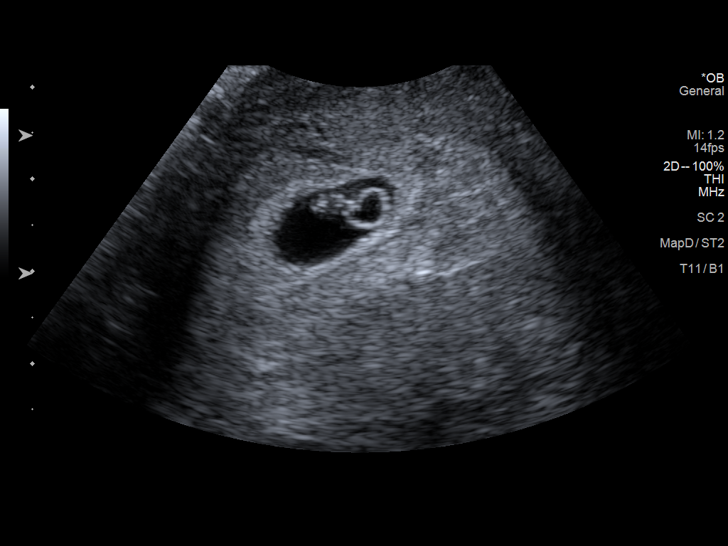
[im 58/105]
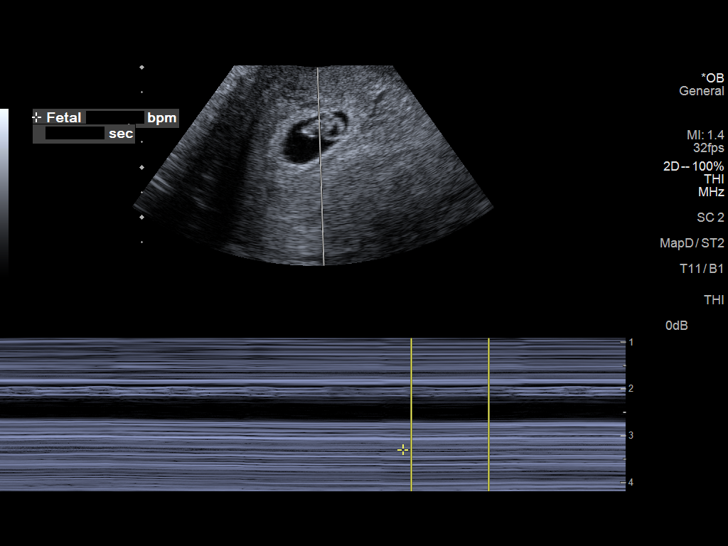
[im 66/105]
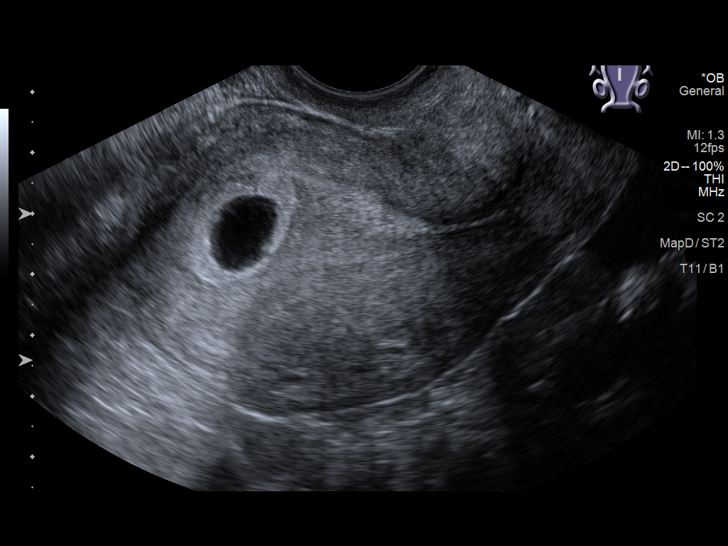
[im 74/105]
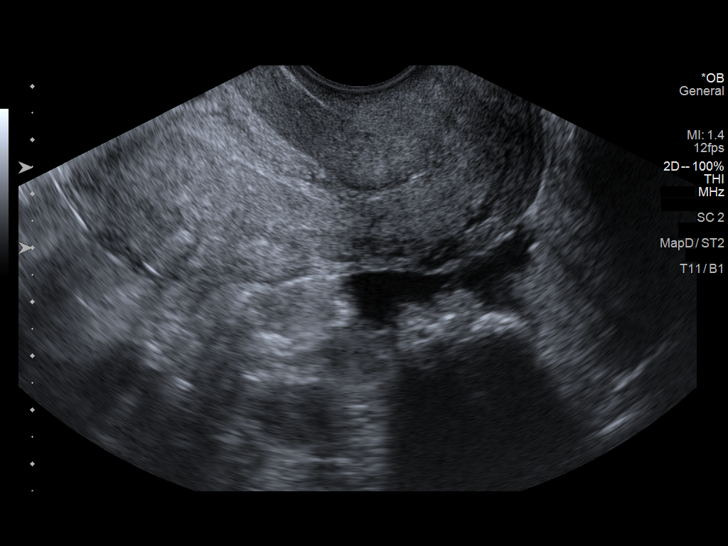
[im 81/105]
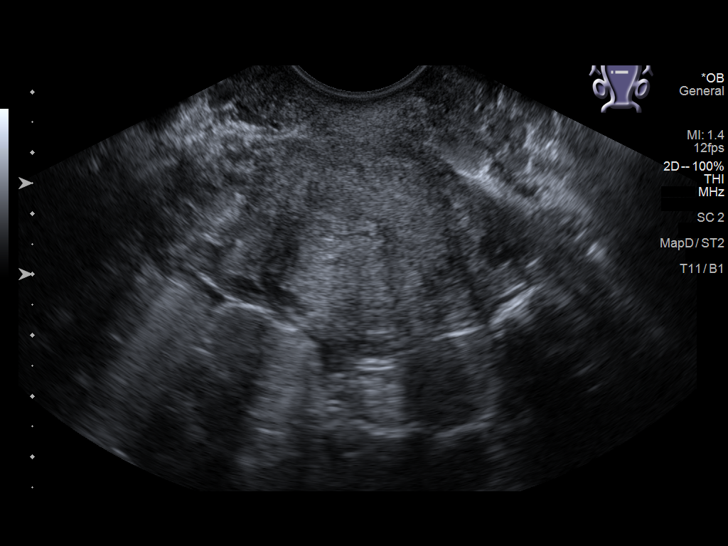
[im 89/105]
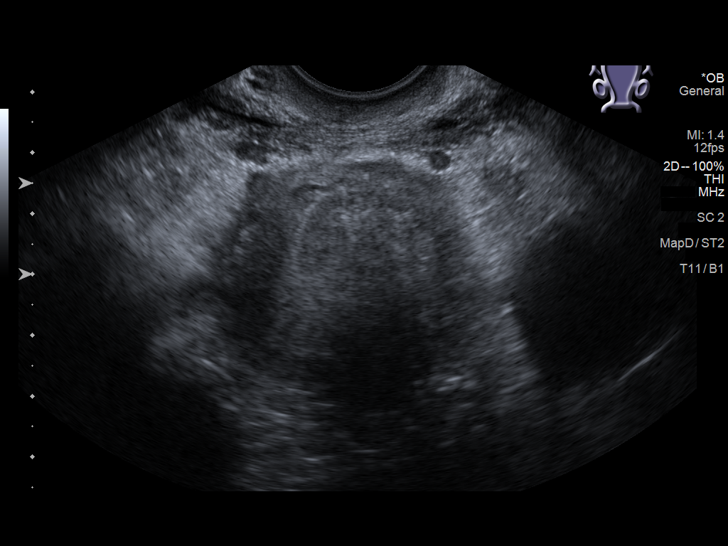
[im 97/105]
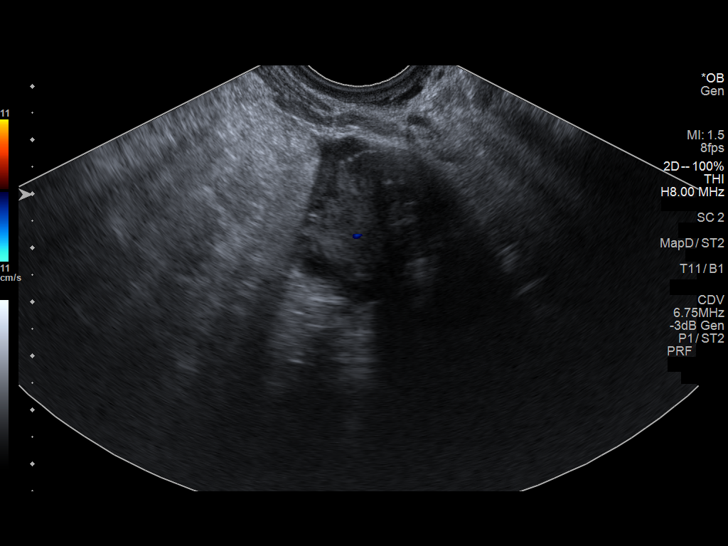
[im 105/105]
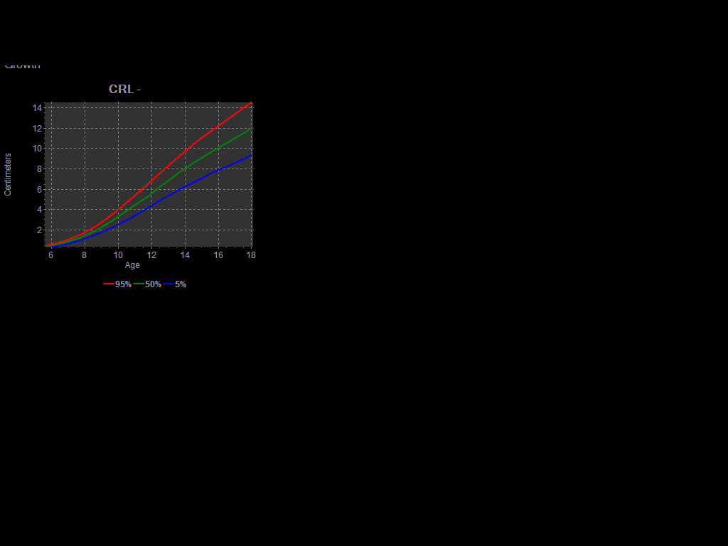

[14 of 28 positions shown; findings below may reference images not displayed]

FINDINGS: Intrauterine gestational sac: Single

Yolk sac:  Visualized.

Embryo:  Visualized.

Cardiac Activity: Visualized.

Heart Rate: 132 bpm

CRL:  5.6 mm   6 w   2 d                  US EDC: 10/05/2022

Subchorionic hemorrhage:  None visualized.

Maternal uterus/adnexae: Uterus is unremarkable. The right ovary is
not well visualized. The left ovary measures 5.2 x 2.8 x 2.9 cm,
with likely corpus luteum cyst measuring 2.2 cm. Trace pelvic free
fluid.
IMPRESSION: 1. Single live intrauterine pregnancy as above, estimated age 6
weeks and 2 days.
2. Probable corpus luteum cyst left ovary measuring 2.2 cm.

## 2022-10-13 ENCOUNTER — Encounter: Payer: Self-pay | Admitting: Obstetrics and Gynecology

## 2022-10-13 ENCOUNTER — Telehealth (INDEPENDENT_AMBULATORY_CARE_PROVIDER_SITE_OTHER): Payer: Medicaid Other | Admitting: Obstetrics and Gynecology

## 2022-10-13 VITALS — Ht 61.0 in | Wt 160.0 lb

## 2022-10-13 DIAGNOSIS — Z1332 Encounter for screening for maternal depression: Secondary | ICD-10-CM | POA: Diagnosis not present

## 2022-10-13 NOTE — Progress Notes (Signed)
   Virtual Visit via Video Note  I connected with NAME@ on 10/13/22 at   4:00 PM EST by a video enabled telemedicine application and verified that I am speaking with the correct person using two identifiers.  Location: Patient: Home Provider: Office   I discussed the limitations of evaluation and management by telemedicine and the availability of in person appointments. The patient expressed understanding and agreed to proceed.    History of Present Illness:   Lori Dixon is a 31 y.o. [phone] female who presents for a 2 week televisit for mood check. She is 2 weeks postpartum following a spontaneous vaginal delivery.  The delivery was at 38.4 gestational weeks.  Postpartum course has been well so far. Baby is feeding by breast. Bleeding: Spotting sometimes. Postpartum depression screening: negative.  EDPS score is 5.      The following portions of the patient's history were reviewed and updated as appropriate: allergies, current medications, past family history, past medical history, past social history, past surgical history, and problem list.   Observations/Objective:   Height 5\' 1"  (1.549 m), weight 160 lb (72.6 kg), currently breastfeeding. Gen App: NAD Psych: normal speech, affect. Good mood.        10/13/2022    4:03 PM 09/26/2022    5:56 AM  Edinburgh Postnatal Depression Scale Screening Tool  I have been able to laugh and see the funny side of things. 1 0  I have looked forward with enjoyment to things. 0 0  I have blamed myself unnecessarily when things went wrong. 2 1  I have been anxious or worried for no good reason. 0 1  I have felt scared or panicky for no good reason. 1 0  Things have been getting on top of me. 1 1  I have been so unhappy that I have had difficulty sleeping. 0 0  I have felt sad or miserable. 0 1  I have been so unhappy that I have been crying. 0 1  The thought of harming myself has occurred to me. 0 0  Edinburgh Postnatal Depression Scale  Total 5 5         Assessment and Plan:   1. Encounter for screening for maternal depression - Screening negative today. Will rescreen at 6 week postpartum visit. Overall doing well.    2. Postpartum state - Overall doing well. Continue routine postpartum home care.    3. Lactating mother - Doing well, no issues.    Follow Up Instructions:     I discussed the assessment and treatment plan with the patient. The patient was provided an opportunity to ask questions and all were answered. The patient agreed with the plan and demonstrated an understanding of the instructions.   The patient was advised to call back or seek an in-person evaluation if the symptoms worsen or if the condition fails to improve as anticipated.   I provided 4 minutes of non-face-to-face time during this encounter.     14/01/2022, MD [facility] at [facility]

## 2022-10-13 NOTE — Patient Instructions (Signed)

## 2022-11-07 ENCOUNTER — Telehealth: Payer: Self-pay | Admitting: Obstetrics

## 2022-11-07 NOTE — Telephone Encounter (Signed)
Left message for patient to call office to r/s appt. MyChart message sent

## 2022-11-10 ENCOUNTER — Ambulatory Visit: Payer: Medicaid Other | Admitting: Obstetrics

## 2022-11-14 ENCOUNTER — Ambulatory Visit (INDEPENDENT_AMBULATORY_CARE_PROVIDER_SITE_OTHER): Payer: Medicaid Other | Admitting: Obstetrics

## 2022-11-14 ENCOUNTER — Encounter: Payer: Self-pay | Admitting: Obstetrics

## 2022-11-14 VITALS — BP 110/76 | HR 98 | Ht 61.0 in | Wt 137.0 lb

## 2022-11-14 DIAGNOSIS — F4321 Adjustment disorder with depressed mood: Secondary | ICD-10-CM

## 2022-11-14 DIAGNOSIS — M5430 Sciatica, unspecified side: Secondary | ICD-10-CM

## 2022-11-14 MED ORDER — ESCITALOPRAM OXALATE 10 MG PO TABS: 10.0000 mg | ORAL_TABLET | Freq: Every day | ORAL | 6 refills | Status: DC

## 2022-11-14 MED ORDER — NORETHIN ACE-ETH ESTRAD-FE 1-20 MG-MCG(24) PO TABS: 1.0000 | ORAL_TABLET | Freq: Every day | ORAL | 3 refills | Status: DC

## 2022-11-14 NOTE — Progress Notes (Signed)
**Lori Dixon De-Identified via Obfuscation** Lori Lori Dixon  Lori Lori Dixon is a 32 y.o. [phone] female who presents for a postpartum visit. She is 7 weeks postpartum following a normal spontaneous vaginal birth .  I have fully reviewed the prenatal and intrapartum course and was present at her birth. The delivery was at [redacted]w[redacted]d.  Anesthesia: none. Postpartum course has been complicated by social stressors and depression. Baby is doing well. Baby is feeding by breast. Bleeding  is light . Bowel function is normal. Bladder function is normal. Patient is not sexually active. Contraception method is OCP (estrogen/progesterone). She reports ongoing back and hip pain that started in pregnancy and has continued. Postpartum depression screening: positive. She has just split with her partner. She is starting back at work tomorrow. She endorses occasional passive thoughts of death, but strongly denies suicidal ideation,  plan, or intent. She would like to start on an SSRI and be referred to psychiatry for medication management and diagnosis (sister was recently diagnosed with borderline personality disorder).   The pregnancy intention screening data noted above was reviewed. Potential methods of contraception were discussed. The patient elected to proceed with OCPs.   Edinburgh Postnatal Depression Scale - 11/14/22 1533       Edinburgh Postnatal Depression Scale:  In the Past 7 Days   I have been able to laugh and see the funny side of things. 1    I have looked forward with enjoyment to things. 1    I have blamed myself unnecessarily when things went wrong. 3    I have been anxious or worried for no good reason. 2    I have felt scared or panicky for no good reason. 2    Things have been getting on top of me. 2    I have been so unhappy that I have had difficulty sleeping. 2    I have felt sad or miserable. 2    I have been so unhappy that I have been crying. 2    The thought of harming myself has occurred to me. 1    Edinburgh  Postnatal Depression Scale Total 18             Health Maintenance Due  Topic Date Due   COVID-19 Vaccine (1) Never done    The following portions of the patient's history were reviewed and updated as appropriate: allergies, current medications, past family history, past medical history, past social history, past surgical history, and problem list.  Review of Systems Pertinent items noted in HPI and remainder of comprehensive ROS otherwise negative.  Objective:  BP 110/76   Pulse 98   Ht 5\' 1"  (1.549 m)   Wt 137 lb (62.1 kg)   LMP 11/13/2022   Breastfeeding Yes   BMI 25.89 kg/m    General:  alert, cooperative, and appears stated age   Breasts:  normal  Lungs: clear to auscultation bilaterally  Heart:  regular rate and rhythm, S1, S2 normal, no murmur, click, rub or gallop  Abdomen: soft, non-tender; bowel sounds normal; no masses,  no organomegaly   Wound : N/A  GU exam:  not indicated       Assessment:    There are no diagnoses linked to this encounter.  Normal postpartum exam.  Major depression  Plan:   Essential components of care per ACOG recommendations:  1.  Mood and well being: Patient with positive depression screening today. Reviewed local resources for support. Will start on Lexapro and titrate up.  Discussed risks and what to do if she has suicidal thoughts. Maternal Mental Health and PSI hotline numbers given, as well as local therapists who take her insurance. - Patient tobacco use? No.   - hx of drug use? No.    2. Infant care and feeding:  -Patient currently breastmilk feeding? Yes   3. Sexuality, contraception and birth spacing - Patient does not want a pregnancy in the next year.  Desired family size is 4 children.  - Reviewed reproductive life planning. Reviewed contraceptive methods based on pt preferences and effectiveness.  Patient desired Oral Contraceptive today.  Considering IUD in the future.  4. Sleep and fatigue -Encouraged  family/partner/community support of 4 hrs of uninterrupted sleep to help with mood and fatigue  5. Physical Recovery  - Discussed patients delivery and complications. She describes her labor as good. [name] had a vaginal birth over an intact perineum.  - Patient has urinary incontinence? once - Patient is safe to resume physical and sexual activity. Lori Dixon given for return to work.  6.  Health Maintenance - HM due items addressed Yes - Last pap smear  Diagnosis  Date Value Ref Range Status  03/25/2020      - Negative for intraepithelial lesion or malignancy (NILM)   Pap smear not done at today's visit.  -Breast Cancer screening indicated? No.   7. Chronic Disease/Pregnancy Condition follow up:  Depression  -Mood check in 4-6 weeks - PCP follow up  Lurlean Horns, [facility] [place], [place]

## 2022-11-15 NOTE — Telephone Encounter (Signed)
Patient was seen on 1/22 with Lori Dixon

## 2022-11-17 ENCOUNTER — Telehealth: Payer: Self-pay

## 2022-11-17 NOTE — Telephone Encounter (Signed)
[facility]- Discharge Call Backs-Left a voice mail for pt about the following below. 1-Do you have any questions or concerns about yourself as you heal? 2-Any concerns or questions about your baby? Is your baby eating, peeing,pooping well? 3- Reviewed ABC's of safe sleep. 4-How was your stay at the hospital? 5-How did our team work together to care for you? You should be receiving a survey in the mail soon.   We would really appreciate it if you could fill that out for [place] and return it in the mail.  We value the feedback to make improvements and continue the great work we do.   If you have any questions please feel free to call me back at [phone]

## 2022-12-14 ENCOUNTER — Telehealth: Payer: Medicaid Other | Admitting: Obstetrics

## 2023-02-22 ENCOUNTER — Ambulatory Visit (INDEPENDENT_AMBULATORY_CARE_PROVIDER_SITE_OTHER): Payer: Medicaid Other | Admitting: Psychiatry

## 2023-02-22 ENCOUNTER — Encounter: Payer: Self-pay | Admitting: Psychiatry

## 2023-02-22 ENCOUNTER — Other Ambulatory Visit
Admission: RE | Admit: 2023-02-22 | Discharge: 2023-02-22 | Disposition: A | Discharge status: Home / Self Care | Payer: Medicaid Other | Source: Ambulatory Visit | Attending: Psychiatry | Admitting: Psychiatry

## 2023-02-22 VITALS — BP 120/77 | HR 66 | Temp 98.0°F | Ht 61.0 in | Wt 139.2 lb

## 2023-02-22 DIAGNOSIS — F419 Anxiety disorder, unspecified: Secondary | ICD-10-CM | POA: Diagnosis not present

## 2023-02-22 DIAGNOSIS — F439 Reaction to severe stress, unspecified: Secondary | ICD-10-CM | POA: Insufficient documentation

## 2023-02-22 DIAGNOSIS — F330 Major depressive disorder, recurrent, mild: Secondary | ICD-10-CM | POA: Diagnosis not present

## 2023-02-22 LAB — TSH: TSH: 1.956 u[IU]/mL (ref 0.350–4.500)

## 2023-02-22 MED ORDER — ESCITALOPRAM OXALATE 10 MG PO TABS: 15.0000 mg | ORAL_TABLET | ORAL | 0 refills | Status: DC

## 2023-02-22 NOTE — Patient Instructions (Signed)
Trauma focussed therapy  Contact Information: mobile: [phone]  email: [email or web address]    [email or web address]  www.[email or web address]  [employer] [email or web address] [address] [place], [place] [address]   [phone]  [facility] [email or web address] [address], [place], [place] [address]   [phone]   [facility] - [phone]  [facility] - [phone]  [facility] - [phone] counseling [phone]  [facility] [phone]   [email or web address] this clinician can offer telehealth and has a sliding scale option  [email or web address] this group also offers sliding scale rates and is based out of [place]  Dr. Liborio Nixon with the [facility] specializes in divorce  [facility] has interns who offer sliding scale rates and some of the full time clinicians do, as well. You complete their contact form on their website and the referrals coordinator will help to get connected to someone
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
## 2023-04-10 ENCOUNTER — Telehealth: Payer: BC Managed Care – PPO | Admitting: Psychiatry

## 2023-05-18 ENCOUNTER — Encounter: Payer: Self-pay | Admitting: Psychiatry

## 2023-05-18 ENCOUNTER — Telehealth: Payer: BC Managed Care – PPO | Admitting: Psychiatry

## 2023-05-18 DIAGNOSIS — F330 Major depressive disorder, recurrent, mild: Secondary | ICD-10-CM

## 2023-05-18 DIAGNOSIS — F419 Anxiety disorder, unspecified: Secondary | ICD-10-CM

## 2023-05-18 DIAGNOSIS — Z9189 Other specified personal risk factors, not elsewhere classified: Secondary | ICD-10-CM | POA: Diagnosis not present

## 2023-05-18 DIAGNOSIS — F439 Reaction to severe stress, unspecified: Secondary | ICD-10-CM

## 2023-05-18 MED ORDER — ESCITALOPRAM OXALATE 20 MG PO TABS: 20.0000 mg | ORAL_TABLET | Freq: Every day | ORAL | 0 refills | Status: DC

## 2023-05-18 NOTE — Patient Instructions (Addendum)
  Medicaid below :  [facility] [address] [place], [place] [address]  [phone]    [employer] [address] [place], [place] [address]  [phone]    [facility] [address] [place], [place] [address]  [phone]   Please call for EKG - [phone]
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
## 2023-05-19 ENCOUNTER — Telehealth: Payer: Self-pay | Admitting: Psychiatry

## 2023-05-19 ENCOUNTER — Ambulatory Visit
Admission: RE | Admit: 2023-05-19 | Discharge: 2023-05-19 | Disposition: A | Discharge status: Home / Self Care | Payer: BC Managed Care – PPO | Source: Ambulatory Visit | Attending: Psychiatry | Admitting: Psychiatry

## 2023-05-19 DIAGNOSIS — F439 Reaction to severe stress, unspecified: Secondary | ICD-10-CM

## 2023-05-19 DIAGNOSIS — F330 Major depressive disorder, recurrent, mild: Secondary | ICD-10-CM | POA: Diagnosis not present

## 2023-05-19 DIAGNOSIS — Z9189 Other specified personal risk factors, not elsewhere classified: Secondary | ICD-10-CM | POA: Insufficient documentation

## 2023-05-19 DIAGNOSIS — I160 Hypertensive urgency: Secondary | ICD-10-CM | POA: Diagnosis not present

## 2023-05-19 MED ORDER — QUETIAPINE FUMARATE 25 MG PO TABS: 25.0000 mg | ORAL_TABLET | Freq: Every day | ORAL | 1 refills | Status: DC

## 2023-05-19 NOTE — Telephone Encounter (Signed)
I have sent Seroquel 25 mg at bedtime to pharmacy.  Reviewed EKG-normal sinus rhythm.

## 2023-05-20 NOTE — Telephone Encounter (Signed)
left message with ekg results and notified that rx was sent to the pharmacy along with the direction on how to take

## 2023-06-19 ENCOUNTER — Telehealth (INDEPENDENT_AMBULATORY_CARE_PROVIDER_SITE_OTHER): Payer: BC Managed Care – PPO | Admitting: Psychiatry

## 2023-06-19 DIAGNOSIS — F330 Major depressive disorder, recurrent, mild: Secondary | ICD-10-CM

## 2023-06-19 NOTE — Progress Notes (Signed)
No response to call or text or video invite  

## 2023-07-12 ENCOUNTER — Ambulatory Visit (INDEPENDENT_AMBULATORY_CARE_PROVIDER_SITE_OTHER): Payer: BC Managed Care – PPO | Admitting: Family Medicine

## 2023-07-12 ENCOUNTER — Encounter: Payer: Self-pay | Admitting: Family Medicine

## 2023-07-12 VITALS — BP 101/64 | HR 63 | Ht 62.0 in | Wt 149.2 lb

## 2023-07-12 DIAGNOSIS — Z0000 Encounter for general adult medical examination without abnormal findings: Secondary | ICD-10-CM | POA: Diagnosis not present

## 2023-07-12 DIAGNOSIS — Z23 Encounter for immunization: Secondary | ICD-10-CM

## 2023-07-12 LAB — URINALYSIS, ROUTINE W REFLEX MICROSCOPIC
Bilirubin, UA: NEGATIVE
Glucose, UA: NEGATIVE
Ketones, UA: NEGATIVE
Nitrite, UA: NEGATIVE
Protein,UA: NEGATIVE
RBC, UA: NEGATIVE
Specific Gravity, UA: >1.03 — ABNORMAL HIGH (ref 1.005–1.030)
Urobilinogen, Ur: 1 mg/dL (ref 0.2–1.0)
pH, UA: 6.5 (ref 5.0–7.5)

## 2023-07-12 LAB — MICROSCOPIC EXAMINATION: Bacteria, UA: NONE SEEN

## 2023-07-12 MED ORDER — NORETHINDRONE ACET-ETHINYL EST 1.5-30 MG-MCG PO TABS: 1.0000 | ORAL_TABLET | Freq: Every day | ORAL | 4 refills | Status: DC

## 2023-07-12 NOTE — Progress Notes (Signed)
BP 101/64   Pulse 63   Ht 5\' 2"  (1.575 m)   Wt 149 lb 3.2 oz (67.7 kg)   SpO2 98%   BMI 27.29 kg/m    Subjective:    Patient ID: Lori Dixon, female    DOB: 02-Aug-1991, 32 y.o.   MRN: [id number]  HPI: Lori Dixon is a 32 y.o. female presenting on 07/12/2023 for comprehensive medical examination. Current medical complaints include: Needs refill on her birth control- lower dose estrogen was making her spot, so she stopped it.   She currently lives with: kids and fiance Menopausal Symptoms: no  Depression Screen done today and results listed below:     07/12/2023    8:42 AM 02/22/2023   10:20 AM 07/14/2021   10:08 AM 07/20/2020    2:23 PM 07/14/2020   10:05 AM  Depression screen PHQ 2/9  Decreased Interest 0  1 1 2   Down, Depressed, Hopeless 0  1 0 2  PHQ - 2 Score 0  2 1 4   Altered sleeping 0  3 1 3   Tired, decreased energy 1  3 3 3   Change in appetite 0  0 0 0  Feeling bad or failure about yourself  0  0 2 2  Trouble concentrating 3  3 3 3   Moving slowly or fidgety/restless 0  0 0 0  Suicidal thoughts 0  0 0 1  PHQ-9 Score 4  11 10 16   Difficult doing work/chores Not difficult at all  Somewhat difficult Somewhat difficult Very difficult     Information is confidential and restricted. Go to Review Flowsheets to unlock data.    Past Medical History:  Past Medical History:  Diagnosis Date   Anxiety    Depression    Heart murmur    AS A CHILD   Leaky heart valve     Surgical History:  Past Surgical History:  Procedure Laterality Date   HYSTEROSCOPY N/A 02/04/2016   Procedure: HYSTEROSCOPY WITH MYOSURE;  Surgeon: Vena Austria, MD;  Location: [facility];  Service: Gynecology;  Laterality: N/A;   LAPAROSCOPY N/A 02/04/2016   Procedure: LAPAROSCOPY DIAGNOSTIC;  Surgeon: Vena Austria, MD;  Location: [facility];  Service: Gynecology;  Laterality: N/A;   NO PAST SURGERIES      Medications:  Current Outpatient Medications on File Prior to Visit  Medication Sig    escitalopram (LEXAPRO) 20 MG tablet Take 1 tablet (20 mg total) by mouth daily.   Norethindrone Acetate-Ethinyl Estrad-FE (LOESTRIN 24 FE) 1-20 MG-MCG(24) tablet Take 1 tablet by mouth daily. (Patient not taking: Reported on 07/12/2023)   prenatal vitamin w/FE, FA (PRENATAL 1 + 1) 27-1 MG TABS tablet Take 1 tablet by mouth daily at 12 noon. (Patient not taking: Reported on 07/12/2023)   QUEtiapine (SEROQUEL) 25 MG tablet Take 1 tablet (25 mg total) by mouth at bedtime. (Patient not taking: Reported on 07/12/2023)   No current facility-administered medications on file prior to visit.    Allergies:  No Known Allergies  Social History:  Social History   Socioeconomic History   Marital status: Legally Separated    Spouse name: Not on file   Number of children: 4   Years of education: Not on file   Highest education level: Associate degree: academic program  Occupational History   Occupation: RMA  Tobacco Use   Smoking status: Never   Smokeless tobacco: Never  Vaping Use   Vaping status: Never Used  Substance and Sexual Activity  Alcohol use: Not Currently    Comment: OCC   Drug use: No   Sexual activity: Yes    Birth control/protection: None, Pill  Other Topics Concern   Not on file  Social History Narrative   Not on file   Social Determinants of Health   Financial Resource Strain: Not on file  Food Insecurity: No Food Insecurity (09/25/2022)   Hunger Vital Sign    Worried About Running Out of Food in the Last Year: Never true    Ran Out of Food in the Last Year: Never true  Transportation Needs: No Transportation Needs (09/25/2022)   PRAPARE - Administrator, Civil Service (Medical): No    Lack of Transportation (Non-Medical): No  Physical Activity: Not on file  Stress: Not on file  Social Connections: Not on file  Intimate Partner Violence: Not At Risk (09/25/2022)   Humiliation, Afraid, Rape, and Kick questionnaire    Fear of Current or Ex-Partner: No     Emotionally Abused: No    Physically Abused: No    Sexually Abused: No   Social History   Tobacco Use  Smoking Status Never  Smokeless Tobacco Never   Social History   Substance and Sexual Activity  Alcohol Use Not Currently   Comment: OCC    Family History:  Family History  Problem Relation Age of Onset   Drug abuse Sister    Personality disorder Sister    Hypertension Maternal Grandfather    Hyperlipidemia Maternal Grandfather    Melanoma Maternal Grandmother    Alcohol abuse Paternal Grandfather     Past medical history, surgical history, medications, allergies, family history and social history reviewed with patient today and changes made to appropriate areas of the chart.   Review of Systems  Constitutional: Negative.   HENT: Negative.    Eyes: Negative.   Respiratory: Negative.    Cardiovascular: Negative.   Gastrointestinal:  Positive for nausea. Negative for abdominal pain, blood in stool, constipation, diarrhea, heartburn, melena and vomiting.  Genitourinary: Negative.   Musculoskeletal:  Positive for back pain. Negative for falls, joint pain, myalgias and neck pain.  Skin: Negative.   Neurological: Negative.   Endo/Heme/Allergies: Negative.   Psychiatric/Behavioral: Negative.     All other ROS negative except what is listed above and in the HPI.      Objective:    BP 101/64   Pulse 63   Ht 5\' 2"  (1.575 m)   Wt 149 lb 3.2 oz (67.7 kg)   SpO2 98%   BMI 27.29 kg/m   Wt Readings from Last 3 Encounters:  07/12/23 149 lb 3.2 oz (67.7 kg)  11/14/22 137 lb (62.1 kg)  10/13/22 160 lb (72.6 kg)    Physical Exam Vitals and nursing note reviewed.  Constitutional:      General: She is not in acute distress.    Appearance: Normal appearance. She is not ill-appearing, toxic-appearing or diaphoretic.  HENT:     Head: Normocephalic and atraumatic.     Right Ear: Tympanic membrane, ear canal and external ear normal. There is no impacted cerumen.     Left  Ear: Tympanic membrane, ear canal and external ear normal. There is no impacted cerumen.     Nose: Nose normal. No congestion or rhinorrhea.     Mouth/Throat:     Mouth: Mucous membranes are moist.     Pharynx: Oropharynx is clear. No oropharyngeal exudate or posterior oropharyngeal erythema.  Eyes:     General:  No scleral icterus.       Right eye: No discharge.        Left eye: No discharge.     Extraocular Movements: Extraocular movements intact.     Conjunctiva/sclera: Conjunctivae normal.     Pupils: Pupils are equal, round, and reactive to light.  Neck:     Vascular: No carotid bruit.  Cardiovascular:     Rate and Rhythm: Normal rate and regular rhythm.     Pulses: Normal pulses.     Heart sounds: No murmur heard.    No friction rub. No gallop.  Pulmonary:     Effort: Pulmonary effort is normal. No respiratory distress.     Breath sounds: Normal breath sounds. No stridor. No wheezing, rhonchi or rales.  Chest:     Chest wall: No tenderness.  Abdominal:     General: Abdomen is flat. Bowel sounds are normal. There is no distension.     Palpations: Abdomen is soft. There is no mass.     Tenderness: There is no abdominal tenderness. There is no right CVA tenderness, left CVA tenderness, guarding or rebound.     Hernia: No hernia is present.  Genitourinary:    Comments: Breast and pelvic exams deferred with shared decision making Musculoskeletal:        General: No swelling, tenderness, deformity or signs of injury.     Cervical back: Normal range of motion and neck supple. No rigidity. No muscular tenderness.     Right lower leg: No edema.     Left lower leg: No edema.  Lymphadenopathy:     Cervical: No cervical adenopathy.  Skin:    General: Skin is warm and dry.     Capillary Refill: Capillary refill takes less than 2 seconds.     Coloration: Skin is not jaundiced or pale.     Findings: No bruising, erythema, lesion or rash.  Neurological:     General: No focal deficit  present.     Mental Status: She is alert and oriented to person, place, and time. Mental status is at baseline.     Cranial Nerves: No cranial nerve deficit.     Sensory: No sensory deficit.     Motor: No weakness.     Coordination: Coordination normal.     Gait: Gait normal.     Deep Tendon Reflexes: Reflexes normal.  Psychiatric:        Mood and Affect: Mood normal.        Behavior: Behavior normal.        Thought Content: Thought content normal.        Judgment: Judgment normal.     Results for orders placed or performed during the hospital encounter of 02/22/23  TSH  Result Value Ref Range   TSH 1.956 0.350 - 4.500 uIU/mL      Assessment & Plan:   Problem List Items Addressed This Visit   None Visit Diagnoses     Routine general medical examination at a health care facility    -  Primary   Vaccines up to date/declined. Screening labs checked today. Pap through GYN. Continue diet and exercise. Call with any concerns.   Relevant Orders   CBC with Differential/Platelet   Comprehensive metabolic panel   Lipid Panel w/o Chol/HDL Ratio   Urinalysis, Routine w reflex microscopic   TSH   Flu vaccine need       Flu shot given today.   Relevant Orders   Flu vaccine trivalent  PF, 6mos and older(Flulaval,Afluria,Fluarix,Fluzone) (Completed)        Follow up plan: Return in about 1 year (around 07/11/2024) for physical.   LABORATORY TESTING:  - Pap smear: done elsewhere  IMMUNIZATIONS:   - Tdap: Tetanus vaccination status reviewed: last tetanus booster within 10 years. - Influenza: Administered today - Pneumovax: Not applicable - Prevnar: Not applicable - COVID: Refused - HPV: Refused  PATIENT COUNSELING:   Advised to take 1 mg of folate supplement per day if capable of pregnancy.   Sexuality: Discussed sexually transmitted diseases, partner selection, use of condoms, avoidance of unintended pregnancy  and contraceptive alternatives.   Advised to avoid cigarette  smoking.  I discussed with the patient that most people either abstain from alcohol or drink within safe limits (<=14/week and <=4 drinks/occasion for males, <=7/weeks and <= 3 drinks/occasion for females) and that the risk for alcohol disorders and other health effects rises proportionally with the number of drinks per week and how often a drinker exceeds daily limits.  Discussed cessation/primary prevention of drug use and availability of treatment for abuse.   Diet: Encouraged to adjust caloric intake to maintain  or achieve ideal body weight, to reduce intake of dietary saturated fat and total fat, to limit sodium intake by avoiding high sodium foods and not adding table salt, and to maintain adequate dietary potassium and calcium preferably from fresh fruits, vegetables, and low-fat dairy products.    stressed the importance of regular exercise  Injury prevention: Discussed safety belts, safety helmets, smoke detector, smoking near bedding or upholstery.   Dental health: Discussed importance of regular tooth brushing, flossing, and dental visits.    NEXT PREVENTATIVE PHYSICAL DUE IN 1 YEAR. Return in about 1 year (around 07/11/2024) for physical.

## 2023-07-13 LAB — CBC WITH DIFFERENTIAL/PLATELET
Basophils Absolute: 0.1 10*3/uL (ref 0.0–0.2)
Basos: 1 %
EOS (ABSOLUTE): 0.4 10*3/uL (ref 0.0–0.4)
Eos: 4 %
Hematocrit: 43.9 % (ref 34.0–46.6)
Hemoglobin: 13.8 g/dL (ref 11.1–15.9)
Immature Grans (Abs): 0 10*3/uL (ref 0.0–0.1)
Immature Granulocytes: 0 %
Lymphocytes Absolute: 2.7 10*3/uL (ref 0.7–3.1)
Lymphs: 30 %
MCH: 29.6 pg (ref 26.6–33.0)
MCHC: 31.4 g/dL — ABNORMAL LOW (ref 31.5–35.7)
MCV: 94 fL (ref 79–97)
Monocytes Absolute: 0.8 10*3/uL (ref 0.1–0.9)
Monocytes: 9 %
Neutrophils Absolute: 5.1 10*3/uL (ref 1.4–7.0)
Neutrophils: 56 %
Platelets: 244 10*3/uL (ref 150–450)
RBC: 4.66 x10E6/uL (ref 3.77–5.28)
RDW: 12 % (ref 11.7–15.4)
WBC: 9 10*3/uL (ref 3.4–10.8)

## 2023-07-13 LAB — COMPREHENSIVE METABOLIC PANEL
ALT: 12 IU/L (ref 0–32)
AST: 17 IU/L (ref 0–40)
Albumin: 4.4 g/dL (ref 3.9–4.9)
Alkaline Phosphatase: 103 IU/L (ref 44–121)
BUN/Creatinine Ratio: 13 (ref 9–23)
BUN: 11 mg/dL (ref 6–20)
Bilirubin Total: <0.2 mg/dL (ref 0.0–1.2)
CO2: 23 mmol/L (ref 20–29)
Calcium: 8.9 mg/dL (ref 8.7–10.2)
Chloride: 104 mmol/L (ref 96–106)
Creatinine, Ser: 0.82 mg/dL (ref 0.57–1.00)
Globulin, Total: 3 g/dL (ref 1.5–4.5)
Glucose: 77 mg/dL (ref 70–99)
Potassium: 3.9 mmol/L (ref 3.5–5.2)
Sodium: 142 mmol/L (ref 134–144)
Total Protein: 7.4 g/dL (ref 6.0–8.5)
eGFR: 98 mL/min/{1.73_m2} (ref >59)

## 2023-07-13 LAB — LIPID PANEL W/O CHOL/HDL RATIO
Cholesterol, Total: 198 mg/dL (ref 100–199)
HDL: 62 mg/dL (ref >39)
LDL Chol Calc (NIH): 120 mg/dL — ABNORMAL HIGH (ref 0–99)
Triglycerides: 92 mg/dL (ref 0–149)
VLDL Cholesterol Cal: 16 mg/dL (ref 5–40)

## 2023-07-13 LAB — TSH: TSH: 3.21 u[IU]/mL (ref 0.450–4.500)

## 2023-10-09 ENCOUNTER — Encounter: Payer: Self-pay | Admitting: Family Medicine

## 2023-10-09 NOTE — Telephone Encounter (Signed)
Appt. Virtual OK

## 2023-10-10 NOTE — Telephone Encounter (Signed)
Attempted to reach patient, LVM to call office back.  Provider stated that she would need an appointment for her concerns, it can be virtual.  Put in CRM.

## 2023-10-12 ENCOUNTER — Encounter: Payer: Self-pay | Admitting: Family Medicine

## 2023-10-12 ENCOUNTER — Telehealth (INDEPENDENT_AMBULATORY_CARE_PROVIDER_SITE_OTHER): Payer: 59 | Admitting: Family Medicine

## 2023-10-12 DIAGNOSIS — F330 Major depressive disorder, recurrent, mild: Secondary | ICD-10-CM

## 2023-10-12 MED ORDER — ESCITALOPRAM OXALATE 20 MG PO TABS: 20.0000 mg | ORAL_TABLET | Freq: Every day | ORAL | 0 refills | Status: AC

## 2023-10-12 NOTE — Progress Notes (Signed)
There were no vitals taken for this visit.   Subjective:    Patient ID: Lori Dixon, female    DOB: Aug 06, 1991, 32 y.o.   MRN: [id number]  HPI: Lori Dixon is a 32 y.o. female  Chief Complaint  Patient presents with   Depression    Patient would like to discuss having a referral for a different Psychiatrist. Patient says she is not happy with the current provider and would like to discuss with provider. Patient says she is wondering if Dr Laural Benes would send in a refill for Lexapro until she gets in with a new provider.    DEPRESSION Mood status: stable Satisfied with current treatment?: yes Symptom severity: mild  Duration of current treatment : chronic Side effects: no Medication compliance: excellent compliance Psychotherapy/counseling: yes in the past Previous psychiatric medications: lexapro Depressed mood: no Anxious mood: no Anhedonia: no Significant weight loss or gain: no Insomnia: no  Fatigue: yes Feelings of worthlessness or guilt: yes Impaired concentration/indecisiveness: no Suicidal ideations: no Hopelessness: no Crying spells: no    07/12/2023    8:42 AM 02/22/2023   10:20 AM 07/14/2021   10:08 AM 07/20/2020    2:23 PM 07/14/2020   10:05 AM  Depression screen PHQ 2/9  Decreased Interest 0  1 1 2   Down, Depressed, Hopeless 0  1 0 2  PHQ - 2 Score 0  2 1 4   Altered sleeping 0  3 1 3   Tired, decreased energy 1  3 3 3   Change in appetite 0  0 0 0  Feeling bad or failure about yourself  0  0 2 2  Trouble concentrating 3  3 3 3   Moving slowly or fidgety/restless 0  0 0 0  Suicidal thoughts 0  0 0 1  PHQ-9 Score 4  11 10 16   Difficult doing work/chores Not difficult at all  Somewhat difficult Somewhat difficult Very difficult     Information is confidential and restricted. Go to Review Flowsheets to unlock data.     Relevant past medical, surgical, family and social history reviewed and updated as indicated. Interim medical history since our last  visit reviewed. Allergies and medications reviewed and updated.  Review of Systems  Constitutional: Negative.   Respiratory: Negative.    Cardiovascular: Negative.   Gastrointestinal: Negative.   Musculoskeletal: Negative.   Psychiatric/Behavioral: Negative.      Per HPI unless specifically indicated above     Objective:    There were no vitals taken for this visit.  Wt Readings from Last 3 Encounters:  07/12/23 149 lb 3.2 oz (67.7 kg)  11/14/22 137 lb (62.1 kg)  10/13/22 160 lb (72.6 kg)    Physical Exam Vitals and nursing note reviewed.  Constitutional:      General: She is not in acute distress.    Appearance: Normal appearance. She is not ill-appearing, toxic-appearing or diaphoretic.  HENT:     Head: Normocephalic and atraumatic.     Right Ear: External ear normal.     Left Ear: External ear normal.     Nose: Nose normal.     Mouth/Throat:     Mouth: Mucous membranes are moist.     Pharynx: Oropharynx is clear.  Eyes:     General: No scleral icterus.       Right eye: No discharge.        Left eye: No discharge.     Conjunctiva/sclera: Conjunctivae normal.     Pupils: Pupils are  equal, round, and reactive to light.  Pulmonary:     Effort: Pulmonary effort is normal. No respiratory distress.     Comments: Speaking in full sentences Musculoskeletal:        General: Normal range of motion.     Cervical back: Normal range of motion.  Skin:    Coloration: Skin is not jaundiced or pale.     Findings: No bruising, erythema, lesion or rash.  Neurological:     Mental Status: She is alert and oriented to person, place, and time. Mental status is at baseline.  Psychiatric:        Mood and Affect: Mood normal.        Behavior: Behavior normal.        Thought Content: Thought content normal.        Judgment: Judgment normal.     Results for orders placed or performed in visit on 07/12/23  Microscopic Examination   Collection Time: 07/12/23  9:13 AM   Urine   Result Value Ref Range   WBC, UA 0-5 0 - 5 /hpf   RBC, Urine 0-2 0 - 2 /hpf   Epithelial Cells (non renal) 0-10 0 - 10 /hpf   Mucus, UA Present (A) Not Estab.   Bacteria, UA None seen None seen/Few  Urinalysis, Routine w reflex microscopic   Collection Time: 07/12/23  9:13 AM  Result Value Ref Range   Specific Gravity, UA >1.030 (H) 1.005 - 1.030   pH, UA 6.5 5.0 - 7.5   Color, UA Yellow Yellow   Appearance Ur Cloudy (A) Clear   Leukocytes,UA Trace (A) Negative   Protein,UA Negative Negative/Trace   Glucose, UA Negative Negative   Ketones, UA Negative Negative   RBC, UA Negative Negative   Bilirubin, UA Negative Negative   Urobilinogen, Ur 1.0 0.2 - 1.0 mg/dL   Nitrite, UA Negative Negative   Microscopic Examination See below:   CBC with Differential/Platelet   Collection Time: 07/12/23  9:16 AM  Result Value Ref Range   WBC 9.0 3.4 - 10.8 x10E3/uL   RBC 4.66 3.77 - 5.28 x10E6/uL   Hemoglobin 13.8 11.1 - 15.9 g/dL   Hematocrit 16.1 09.6 - 46.6 %   MCV 94 79 - 97 fL   MCH 29.6 26.6 - 33.0 pg   MCHC 31.4 (L) 31.5 - 35.7 g/dL   RDW 04.5 40.9 - 81.1 %   Platelets 244 150 - 450 x10E3/uL   Neutrophils 56 Not Estab. %   Lymphs 30 Not Estab. %   Monocytes 9 Not Estab. %   Eos 4 Not Estab. %   Basos 1 Not Estab. %   Neutrophils Absolute 5.1 1.4 - 7.0 x10E3/uL   Lymphocytes Absolute 2.7 0.7 - 3.1 x10E3/uL   Monocytes Absolute 0.8 0.1 - 0.9 x10E3/uL   EOS (ABSOLUTE) 0.4 0.0 - 0.4 x10E3/uL   Basophils Absolute 0.1 0.0 - 0.2 x10E3/uL   Immature Granulocytes 0 Not Estab. %   Immature Grans (Abs) 0.0 0.0 - 0.1 x10E3/uL  Comprehensive metabolic panel   Collection Time: 07/12/23  9:16 AM  Result Value Ref Range   Glucose 77 70 - 99 mg/dL   BUN 11 6 - 20 mg/dL   Creatinine, Ser 9.14 0.57 - 1.00 mg/dL   eGFR 98 >78 GN/FAO/1.30   BUN/Creatinine Ratio 13 9 - 23   Sodium 142 134 - 144 mmol/L   Potassium 3.9 3.5 - 5.2 mmol/L   Chloride 104 96 - 106 mmol/L  CO2 23 20 - 29 mmol/L    Calcium 8.9 8.7 - 10.2 mg/dL   Total Protein 7.4 6.0 - 8.5 g/dL   Albumin 4.4 3.9 - 4.9 g/dL   Globulin, Total 3.0 1.5 - 4.5 g/dL   Bilirubin Total <1.6 0.0 - 1.2 mg/dL   Alkaline Phosphatase 103 44 - 121 IU/L   AST 17 0 - 40 IU/L   ALT 12 0 - 32 IU/L  Lipid Panel w/o Chol/HDL Ratio   Collection Time: 07/12/23  9:16 AM  Result Value Ref Range   Cholesterol, Total 198 100 - 199 mg/dL   Triglycerides 92 0 - 149 mg/dL   HDL 62 >10 mg/dL   VLDL Cholesterol Cal 16 5 - 40 mg/dL   LDL Chol Calc (NIH) 960 (H) 0 - 99 mg/dL  TSH   Collection Time: 07/12/23  9:16 AM  Result Value Ref Range   TSH 3.210 0.450 - 4.500 uIU/mL      Assessment & Plan:   Problem List Items Addressed This Visit       Other   Mild episode of recurrent major depressive disorder (HCC)   Does not want to continue with her current psychiatrist. Will get her new referral for psychiatrist in [place]. Refill of her lexapro sent to her pharmacy. Stable on current dose. Call with any concerns.       Relevant Medications   escitalopram (LEXAPRO) 20 MG tablet   Other Relevant Orders   Ambulatory referral to Psychiatry     Follow up plan: Return for As scheduled.   This visit was completed via video visit through MyChart due to the restrictions of the COVID-19 pandemic. All issues as above were discussed and addressed. Physical exam was done as above through visual confirmation on video through MyChart. If it was felt that the patient should be evaluated in the office, they were directed there. The patient verbally consented to this visit. Location of the patient: parking lot Location of the provider: work Those involved with this call:  Provider: Olevia Perches, DO CMA: Malen Gauze, CMA Front Desk/Registration:  Servando Snare   Time spent on call:  15 minutes with patient face to face via video conference. More than 50% of this time was spent in counseling and coordination of care. 23 minutes total spent in review  of patient's record and preparation of their chart.

## 2023-10-12 NOTE — Assessment & Plan Note (Signed)
Does not want to continue with her current psychiatrist. Will get her new referral for psychiatrist in [place]. Refill of her lexapro sent to her pharmacy. Stable on current dose. Call with any concerns.

## 2023-11-20 ENCOUNTER — Ambulatory Visit: Payer: Commercial Managed Care - PPO | Admitting: Obstetrics

## 2023-12-06 ENCOUNTER — Telehealth (INDEPENDENT_AMBULATORY_CARE_PROVIDER_SITE_OTHER): Payer: Commercial Managed Care - PPO | Admitting: Family Medicine

## 2023-12-06 ENCOUNTER — Encounter: Payer: Self-pay | Admitting: Family Medicine

## 2023-12-06 VITALS — Ht 61.0 in | Wt 155.7 lb

## 2023-12-06 DIAGNOSIS — E663 Overweight: Secondary | ICD-10-CM | POA: Diagnosis not present

## 2023-12-06 DIAGNOSIS — Z6829 Body mass index (BMI) 29.0-29.9, adult: Secondary | ICD-10-CM | POA: Diagnosis not present

## 2023-12-06 MED ORDER — SEMAGLUTIDE-WEIGHT MANAGEMENT 0.5 MG/0.5ML ~~LOC~~ SOAJ: 0.5000 mg | SUBCUTANEOUS | 1 refills | Status: AC

## 2023-12-06 MED ORDER — SEMAGLUTIDE-WEIGHT MANAGEMENT 0.25 MG/0.5ML ~~LOC~~ SOAJ: 0.2500 mg | SUBCUTANEOUS | 0 refills | Status: AC

## 2023-12-06 NOTE — Progress Notes (Signed)
Ht 5\' 1"  (1.549 m)   Wt 155 lb 11.2 oz (70.6 kg)   LMP  (LMP Unknown)   Breastfeeding No   BMI 29.42 kg/m    Subjective:    Patient ID: Lori Dixon, female    DOB: 12/28/1990, 33 y.o.   MRN: [id number]  HPI: Lori Dixon is a 33 y.o. female  Chief Complaint  Patient presents with   Obesity    Talk about options, considering different medications.    OBESITY Duration: since her last pregnancy Previous attempts at weight loss: diets- cutting carbs, portion control, active at work, exercising 30 minutes a couple of times a week  Complications of obesity: depression, PCOS Peak weight: 155 (current) Weight loss goal: 30lbs Weight loss to date: none Requesting obesity pharmacotherapy: yes Current weight loss supplements/medications: no Previous weight loss supplements/meds: no  Clinical coverage for weight loss GLP's   Medication being dispensed is Wegovy 3 mL/28 day. Titration doses are 2 mL/28 days.   [x]  Product being prescribed is FDA approved for the indication, age, weight (if applicable) and not does not exceed dosing limits per the Prescribing Information per the clinical conditions for use.  [x]  Patient's baseline weight measured within the last 45 days as required by provider before dispensing.  [x]  Patient is new to therapy and One of the following:   [x]  The beneficiary is 32 years of age or over and has ONE of the following:  []  A BMI greater than or equal to 30 kg/m2  [x]  A BMI greater than or equal to 27 kg/m2 with at least one weight-related comorbidity/risk factor/complication (i.e. hypertension, type 2 diabetes, obstructive sleep apnea, cardiovascular disease, dyslipidemia)  [x]  If patient has one weight-related comorbidity/risk factor/complication (i.e. hypertension, type 2 diabetes, obstructive sleep apnea, cardiovascular disease, dyslipidemia), please list: Patient suffers from weight-related comorbidity/risk factor/complication: depression,  PCOS  [x]  The beneficiary is currently on and will continue lifestyle modification including structured nutrition and physical activity, unless physical activity is not clinically appropriate at the time GLP1 therapy commences AND  [x]  The beneficiary will NOT be using the requested agent in combination with another GLP-1 receptor agonist agent AND  [x]  The beneficiary does NOT have any FDA-labeled contraindications to the requested agent, including pregnancy, lactation, history of medullary thyroid cancer or multiple endocrine neoplasia type II.   Last BMI/Weight/Height recorded Estimated body mass index is 29.42 kg/m as calculated from the following:   Height as of this encounter: 5\' 1"  (1.549 m).   Weight as of this encounter: 155 lb 11.2 oz (70.6 kg).    Relevant past medical, surgical, family and social history reviewed and updated as indicated. Interim medical history since our last visit reviewed. Allergies and medications reviewed and updated.  Review of Systems  Constitutional: Negative.   Respiratory: Negative.    Cardiovascular: Negative.   Gastrointestinal: Negative.   Musculoskeletal: Negative.     Per HPI unless specifically indicated above     Objective:    Ht 5\' 1"  (1.549 m)   Wt 155 lb 11.2 oz (70.6 kg)   LMP  (LMP Unknown)   Breastfeeding No   BMI 29.42 kg/m   Wt Readings from Last 3 Encounters:  12/06/23 155 lb 11.2 oz (70.6 kg)  07/12/23 149 lb 3.2 oz (67.7 kg)  11/14/22 137 lb (62.1 kg)    Physical Exam Vitals and nursing note reviewed.  Constitutional:      General: She is not in acute distress.  Appearance: Normal appearance. She is not ill-appearing, toxic-appearing or diaphoretic.  HENT:     Head: Normocephalic and atraumatic.     Right Ear: External ear normal.     Left Ear: External ear normal.     Nose: Nose normal.     Mouth/Throat:     Mouth: Mucous membranes are moist.     Pharynx: Oropharynx is clear.  Eyes:     General: No  scleral icterus.       Right eye: No discharge.        Left eye: No discharge.     Conjunctiva/sclera: Conjunctivae normal.     Pupils: Pupils are equal, round, and reactive to light.  Pulmonary:     Effort: Pulmonary effort is normal. No respiratory distress.     Comments: Speaking in full sentences Musculoskeletal:        General: Normal range of motion.     Cervical back: Normal range of motion.  Skin:    Coloration: Skin is not jaundiced or pale.     Findings: No bruising, erythema, lesion or rash.  Neurological:     Mental Status: She is alert and oriented to person, place, and time. Mental status is at baseline.  Psychiatric:        Mood and Affect: Mood normal.        Behavior: Behavior normal.        Thought Content: Thought content normal.        Judgment: Judgment normal.     Results for orders placed or performed in visit on 07/12/23  Microscopic Examination   Collection Time: 07/12/23  9:13 AM   Urine  Result Value Ref Range   WBC, UA 0-5 0 - 5 /hpf   RBC, Urine 0-2 0 - 2 /hpf   Epithelial Cells (non renal) 0-10 0 - 10 /hpf   Mucus, UA Present (A) Not Estab.   Bacteria, UA None seen None seen/Few  Urinalysis, Routine w reflex microscopic   Collection Time: 07/12/23  9:13 AM  Result Value Ref Range   Specific Gravity, UA >1.030 (H) 1.005 - 1.030   pH, UA 6.5 5.0 - 7.5   Color, UA Yellow Yellow   Appearance Ur Cloudy (A) Clear   Leukocytes,UA Trace (A) Negative   Protein,UA Negative Negative/Trace   Glucose, UA Negative Negative   Ketones, UA Negative Negative   RBC, UA Negative Negative   Bilirubin, UA Negative Negative   Urobilinogen, Ur 1.0 0.2 - 1.0 mg/dL   Nitrite, UA Negative Negative   Microscopic Examination See below:   CBC with Differential/Platelet   Collection Time: 07/12/23  9:16 AM  Result Value Ref Range   WBC 9.0 3.4 - 10.8 x10E3/uL   RBC 4.66 3.77 - 5.28 x10E6/uL   Hemoglobin 13.8 11.1 - 15.9 g/dL   Hematocrit 69.6 29.5 - 46.6 %    MCV 94 79 - 97 fL   MCH 29.6 26.6 - 33.0 pg   MCHC 31.4 (L) 31.5 - 35.7 g/dL   RDW 28.4 13.2 - 44.0 %   Platelets 244 150 - 450 x10E3/uL   Neutrophils 56 Not Estab. %   Lymphs 30 Not Estab. %   Monocytes 9 Not Estab. %   Eos 4 Not Estab. %   Basos 1 Not Estab. %   Neutrophils Absolute 5.1 1.4 - 7.0 x10E3/uL   Lymphocytes Absolute 2.7 0.7 - 3.1 x10E3/uL   Monocytes Absolute 0.8 0.1 - 0.9 x10E3/uL   EOS (ABSOLUTE) 0.4  0.0 - 0.4 x10E3/uL   Basophils Absolute 0.1 0.0 - 0.2 x10E3/uL   Immature Granulocytes 0 Not Estab. %   Immature Grans (Abs) 0.0 0.0 - 0.1 x10E3/uL  Comprehensive metabolic panel   Collection Time: 07/12/23  9:16 AM  Result Value Ref Range   Glucose 77 70 - 99 mg/dL   BUN 11 6 - 20 mg/dL   Creatinine, Ser 1.61 0.57 - 1.00 mg/dL   eGFR 98 >09 UE/AVW/0.98   BUN/Creatinine Ratio 13 9 - 23   Sodium 142 134 - 144 mmol/L   Potassium 3.9 3.5 - 5.2 mmol/L   Chloride 104 96 - 106 mmol/L   CO2 23 20 - 29 mmol/L   Calcium 8.9 8.7 - 10.2 mg/dL   Total Protein 7.4 6.0 - 8.5 g/dL   Albumin 4.4 3.9 - 4.9 g/dL   Globulin, Total 3.0 1.5 - 4.5 g/dL   Bilirubin Total <1.1 0.0 - 1.2 mg/dL   Alkaline Phosphatase 103 44 - 121 IU/L   AST 17 0 - 40 IU/L   ALT 12 0 - 32 IU/L  Lipid Panel w/o Chol/HDL Ratio   Collection Time: 07/12/23  9:16 AM  Result Value Ref Range   Cholesterol, Total 198 100 - 199 mg/dL   Triglycerides 92 0 - 149 mg/dL   HDL 62 >91 mg/dL   VLDL Cholesterol Cal 16 5 - 40 mg/dL   LDL Chol Calc (NIH) 478 (H) 0 - 99 mg/dL  TSH   Collection Time: 07/12/23  9:16 AM  Result Value Ref Range   TSH 3.210 0.450 - 4.500 uIU/mL      Assessment & Plan:   Problem List Items Addressed This Visit   None Visit Diagnoses       Overweight (BMI 25.0-29.9)    -  Primary   With PCOS and depression comorbidities. Has current diet and exercise plan which she will cotinue. Will try wegovy- if not covered, will try metformin for PCOS.        Follow up plan: Return in about  6 weeks (around 01/17/2024).   This visit was completed via video visit through MyChart due to the restrictions of the COVID-19 pandemic. All issues as above were discussed and addressed. Physical exam was done as above through visual confirmation on video through MyChart. If it was felt that the patient should be evaluated in the office, they were directed there. The patient verbally consented to this visit. Location of the patient: work Location of the provider: work Those involved with this call:  Provider: Olevia Perches, DO CMA: Arnetha Gula, CMA Front Desk/Registration: Servando Snare  Time spent on call:  15 minutes with patient face to face via video conference. More than 50% of this time was spent in counseling and coordination of care. 23 minutes total spent in review of patient's record and preparation of their chart.

## 2023-12-25 ENCOUNTER — Ambulatory Visit: Payer: Commercial Managed Care - PPO | Admitting: Certified Nurse Midwife

## 2024-01-02 ENCOUNTER — Encounter: Payer: Self-pay | Admitting: Certified Nurse Midwife

## 2024-01-05 MED ORDER — BUPROPION HCL ER (SR) 150 MG PO TB12: ORAL_TABLET | ORAL | 2 refills | Status: AC

## 2024-01-05 MED ORDER — NALTREXONE HCL 50 MG PO TABS: 25.0000 mg | ORAL_TABLET | Freq: Every day | ORAL | 2 refills | Status: AC

## 2024-07-15 ENCOUNTER — Encounter: Payer: Self-pay | Admitting: Family Medicine
